# Patient Record
Sex: Female | Born: 1980 | Race: White | Hispanic: No | Marital: Single | Smoking: Former smoker
Health system: Southern US, Community
[De-identification: ages and names within clinical notes are randomized; demographics above are authoritative.]

## PROBLEM LIST (undated history)

## (undated) ENCOUNTER — Inpatient Hospital Stay: Payer: Self-pay

## (undated) DIAGNOSIS — O24419 Gestational diabetes mellitus in pregnancy, unspecified control: Secondary | ICD-10-CM

## (undated) DIAGNOSIS — R87629 Unspecified abnormal cytological findings in specimens from vagina: Secondary | ICD-10-CM

## (undated) DIAGNOSIS — M1990 Unspecified osteoarthritis, unspecified site: Secondary | ICD-10-CM

## (undated) DIAGNOSIS — R569 Unspecified convulsions: Secondary | ICD-10-CM

## (undated) DIAGNOSIS — O139 Gestational [pregnancy-induced] hypertension without significant proteinuria, unspecified trimester: Secondary | ICD-10-CM

## (undated) DIAGNOSIS — K219 Gastro-esophageal reflux disease without esophagitis: Secondary | ICD-10-CM

## (undated) DIAGNOSIS — F419 Anxiety disorder, unspecified: Secondary | ICD-10-CM

## (undated) HISTORY — DX: Unspecified osteoarthritis, unspecified site: M19.90

## (undated) HISTORY — DX: Unspecified convulsions: R56.9

## (undated) HISTORY — PX: ANKLE FRACTURE SURGERY: SHX122

## (undated) HISTORY — DX: Unspecified abnormal cytological findings in specimens from vagina: R87.629

## (undated) HISTORY — DX: Anxiety disorder, unspecified: F41.9

## (undated) HISTORY — DX: Gestational diabetes mellitus in pregnancy, unspecified control: O24.419

## (undated) HISTORY — DX: Gestational (pregnancy-induced) hypertension without significant proteinuria, unspecified trimester: O13.9

## (undated) HISTORY — DX: Gastro-esophageal reflux disease without esophagitis: K21.9

---

## 2015-10-09 DIAGNOSIS — M542 Cervicalgia: Secondary | ICD-10-CM

## 2015-10-09 DIAGNOSIS — G8929 Other chronic pain: Secondary | ICD-10-CM | POA: Insufficient documentation

## 2015-12-01 DIAGNOSIS — M25511 Pain in right shoulder: Secondary | ICD-10-CM

## 2015-12-01 DIAGNOSIS — M50222 Other cervical disc displacement at C5-C6 level: Secondary | ICD-10-CM | POA: Insufficient documentation

## 2015-12-01 DIAGNOSIS — G8929 Other chronic pain: Secondary | ICD-10-CM | POA: Insufficient documentation

## 2015-12-01 DIAGNOSIS — Z8781 Personal history of (healed) traumatic fracture: Secondary | ICD-10-CM | POA: Insufficient documentation

## 2015-12-05 ENCOUNTER — Emergency Department
Admission: EM | Admit: 2015-12-05 | Discharge: 2015-12-05 | Disposition: A | Discharge status: Home / Self Care | Payer: Medicaid Other | Attending: Emergency Medicine | Admitting: Emergency Medicine

## 2015-12-05 ENCOUNTER — Encounter: Payer: Self-pay | Admitting: Emergency Medicine

## 2015-12-05 ENCOUNTER — Emergency Department: Payer: Medicaid Other

## 2015-12-05 DIAGNOSIS — R569 Unspecified convulsions: Secondary | ICD-10-CM

## 2015-12-05 DIAGNOSIS — Z0283 Encounter for blood-alcohol and blood-drug test: Secondary | ICD-10-CM | POA: Insufficient documentation

## 2015-12-05 DIAGNOSIS — I639 Cerebral infarction, unspecified: Secondary | ICD-10-CM | POA: Diagnosis not present

## 2015-12-05 LAB — CBC
HEMATOCRIT: 40.5 % (ref 35.0–47.0)
Hemoglobin: 14.4 g/dL (ref 12.0–16.0)
MCH: 31.8 pg (ref 26.0–34.0)
MCHC: 35.6 g/dL (ref 32.0–36.0)
MCV: 89.3 fL (ref 80.0–100.0)
Platelets: 324 10*3/uL (ref 150–440)
RBC: 4.53 MIL/uL (ref 3.80–5.20)
RDW: 13.5 % (ref 11.5–14.5)
WBC: 13.2 10*3/uL — ABNORMAL HIGH (ref 3.6–11.0)

## 2015-12-05 LAB — URINALYSIS COMPLETE WITH MICROSCOPIC (ARMC ONLY)
BACTERIA UA: NONE SEEN
Bilirubin Urine: NEGATIVE
GLUCOSE, UA: NEGATIVE mg/dL
LEUKOCYTES UA: NEGATIVE
Nitrite: NEGATIVE
Protein, ur: 30 mg/dL — AB
Specific Gravity, Urine: 1.02 (ref 1.005–1.030)
pH: 5 (ref 5.0–8.0)

## 2015-12-05 LAB — BASIC METABOLIC PANEL
Anion gap: 9 (ref 5–15)
BUN: 10 mg/dL (ref 6–20)
CHLORIDE: 101 mmol/L (ref 101–111)
CO2: 27 mmol/L (ref 22–32)
CREATININE: 0.78 mg/dL (ref 0.44–1.00)
Calcium: 9.8 mg/dL (ref 8.9–10.3)
GFR calc Af Amer: >60 mL/min (ref >60)
GFR calc non Af Amer: >60 mL/min (ref >60)
GLUCOSE: 122 mg/dL — AB (ref 65–99)
POTASSIUM: 4.3 mmol/L (ref 3.5–5.1)
Sodium: 137 mmol/L (ref 135–145)

## 2015-12-05 LAB — URINE DRUG SCREEN, QUALITATIVE (ARMC ONLY)
AMPHETAMINES, UR SCREEN: NOT DETECTED
BARBITURATES, UR SCREEN: NOT DETECTED
BENZODIAZEPINE, UR SCRN: POSITIVE — AB
COCAINE METABOLITE, UR ~~LOC~~: NOT DETECTED
Cannabinoid 50 Ng, Ur ~~LOC~~: NOT DETECTED
MDMA (Ecstasy)Ur Screen: NOT DETECTED
METHADONE SCREEN, URINE: NOT DETECTED
Opiate, Ur Screen: POSITIVE — AB
Phencyclidine (PCP) Ur S: POSITIVE — AB
TRICYCLIC, UR SCREEN: NOT DETECTED

## 2015-12-05 LAB — GLUCOSE, CAPILLARY: Glucose-Capillary: 126 mg/dL — ABNORMAL HIGH (ref 65–99)

## 2015-12-05 LAB — POCT PREGNANCY, URINE: PREG TEST UR: NEGATIVE

## 2015-12-05 MED ORDER — HYDROCODONE-ACETAMINOPHEN 5-325 MG PO TABS: 1.0000 | ORAL_TABLET | ORAL | Status: DC | PRN

## 2015-12-05 NOTE — ED Notes (Signed)
Pt to room 32 via w/c with no distress noted, no c/o voiced at present; pt able to ambulate to stretcher without difficulty or distress; st approx hr PTA was sitting on couch watching TV and suddenly fell back with jerking movements that lasted approx 30sec--witnessed by SO who is present at bedside; pt st felt groggy immediately after with some vomiting; denies any recent illness; fx ankle surgery month ago; A&Ox3, PERRL, MAEW

## 2015-12-05 NOTE — Discharge Instructions (Signed)
Please use extreme caution when driving. Do not take tramadol or Ultram any longer. Return to the emergency department for any further seizure-like activity.   Seizure, Adult A seizure is abnormal electrical activity in the brain. Seizures usually last from 30 seconds to 2 minutes. There are various types of seizures. Before a seizure, you may have a warning sensation (aura) that a seizure is about to occur. An aura may include the following symptoms:   Fear or anxiety.  Nausea.  Feeling like the room is spinning (vertigo).  Vision changes, such as seeing flashing lights or spots. Common symptoms during a seizure include:  A change in attention or behavior (altered mental status).  Convulsions with rhythmic jerking movements.  Drooling.  Rapid eye movements.  Grunting.  Loss of bladder and bowel control.  Bitter taste in the mouth.  Tongue biting. After a seizure, you may feel confused and sleepy. You may also have an injury resulting from convulsions during the seizure. HOME CARE INSTRUCTIONS   If you are given medicines, take them exactly as prescribed by your health care provider.  Keep all follow-up appointments as directed by your health care provider.  Do not swim or drive or engage in risky activity during which a seizure could cause further injury to you or others until your health care provider says it is OK.  Get adequate rest.  Teach friends and family what to do if you have a seizure. They should:  Lay you on the ground to prevent a fall.  Put a cushion under your head.  Loosen any tight clothing around your neck.  Turn you on your side. If vomiting occurs, this helps keep your airway clear.  Stay with you until you recover.  Know whether or not you need emergency care. SEEK IMMEDIATE MEDICAL CARE IF:  The seizure lasts longer than 5 minutes.  The seizure is severe or you do not wake up immediately after the seizure.  You have an altered  mental status after the seizure.  You are having more frequent or worsening seizures. Someone should drive you to the emergency department or call local emergency services (911 in [place] MAKE SURE YOU:  Understand these instructions.  Will watch your condition.  Will get help right away if you are not doing well or get worse.   This information is not intended to replace advice given to you by your health care provider. Make sure you discuss any questions you have with your health care provider.   Document Released: 05/24/2000 Document Revised: 06/17/2014 Document Reviewed: 01/06/2013 Elsevier Interactive Patient Education [employer]2016 Elsevier Inc.
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
## 2015-12-05 NOTE — ED Notes (Signed)
Pt presents to ED with c/o possible seizure just prior to arrival. Witnessed by pt friend who states they were watching a movie and pt fell back on couch with jerking motions for about 30 seconds. Pt states she has no hx of the same and was confused about what happened upon awaking. Denies any injuries or pain. Pt was not incontinent of urine and has reportedly been vomiting (X4) since seizure like activity. No abnormal symptoms prior to this event.

## 2015-12-05 NOTE — ED Notes (Signed)

## 2016-02-07 ENCOUNTER — Encounter: Payer: Self-pay | Admitting: Emergency Medicine

## 2016-02-07 ENCOUNTER — Emergency Department: Payer: Medicaid Other

## 2016-02-07 ENCOUNTER — Emergency Department
Admission: EM | Admit: 2016-02-07 | Discharge: 2016-02-07 | Disposition: A | Discharge status: Home / Self Care | Payer: Medicaid Other | Attending: Emergency Medicine | Admitting: Emergency Medicine

## 2016-02-07 DIAGNOSIS — G8929 Other chronic pain: Secondary | ICD-10-CM | POA: Diagnosis not present

## 2016-02-07 DIAGNOSIS — M25572 Pain in left ankle and joints of left foot: Secondary | ICD-10-CM | POA: Diagnosis not present

## 2016-02-07 DIAGNOSIS — M79672 Pain in left foot: Secondary | ICD-10-CM

## 2016-02-07 MED ORDER — OMEPRAZOLE 10 MG PO CPDR: 10.0000 mg | DELAYED_RELEASE_CAPSULE | Freq: Every day | ORAL | 0 refills | Status: DC

## 2016-02-07 MED ORDER — MELOXICAM 15 MG PO TABS: 15.0000 mg | ORAL_TABLET | Freq: Every day | ORAL | 0 refills | Status: DC
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
## 2016-02-07 NOTE — ED Triage Notes (Signed)
States she is having pain to left ankle  Had hardware placed in left ankle in may  [place].  conts to have increased pain

## 2016-03-12 ENCOUNTER — Telehealth: Payer: Self-pay

## 2016-03-12 ENCOUNTER — Ambulatory Visit: Payer: Medicaid Other

## 2016-03-12 ENCOUNTER — Ambulatory Visit (INDEPENDENT_AMBULATORY_CARE_PROVIDER_SITE_OTHER): Payer: Medicaid Other | Admitting: Obstetrics and Gynecology

## 2016-03-12 ENCOUNTER — Other Ambulatory Visit: Payer: Self-pay | Admitting: Obstetrics and Gynecology

## 2016-03-12 VITALS — BP 126/93 | HR 74 | Wt 242.0 lb

## 2016-03-12 DIAGNOSIS — Z3202 Encounter for pregnancy test, result negative: Secondary | ICD-10-CM

## 2016-03-12 DIAGNOSIS — Z3687 Encounter for antenatal screening for uncertain dates: Secondary | ICD-10-CM

## 2016-03-12 DIAGNOSIS — N926 Irregular menstruation, unspecified: Secondary | ICD-10-CM

## 2016-03-12 DIAGNOSIS — O039 Complete or unspecified spontaneous abortion without complication: Secondary | ICD-10-CM

## 2016-03-12 NOTE — Telephone Encounter (Signed)
Pt calls and wanted to know if she could make an appt to check about her missed menses and of course she can and she will call back tomorrow to make this appt. Also wanted to know if she could have a refill on her xanax (2 week supply) until she could get it from another provider and I did inform her that she was not seen by a provider so they would not do this, and this is not a medication they typically prescribed. Pt voiced understanding.

## 2016-03-12 NOTE — Patient Instructions (Signed)
Pregnancy and Zika Virus Disease Zika virus disease, or Zika, is an illness that can spread to people from mosquitoes that carry the virus. It may also spread from person to person through infected body fluids. Zika first occurred in [place], but recently it has spread to new areas. The virus occurs in tropical climates. The location of Zika continues to change. Most people who become infected with Zika virus do not develop serious illness. However, Zika may cause birth defects in an unborn baby whose mother is infected with the virus. It may also increase the risk of miscarriage. WHAT ARE THE SYMPTOMS OF ZIKA VIRUS DISEASE? In many cases, people who have been infected with Zika virus do not develop any symptoms. If symptoms appear, they usually start about a week after the person is infected. Symptoms are usually mild. They may include:  Fever.  Rash.  Red eyes.  Joint pain. HOW DOES ZIKA VIRUS DISEASE SPREAD? The main way that Zika virus spreads is through the bite of a certain type of mosquito. Unlike most types of mosquitos, which bite only at night, the type of mosquito that carries Zika virus bites both at night and during the day. Zika virus can also spread through sexual contact, through a blood transfusion, and from a mother to her baby before or during birth. Once you have had Zika virus disease, it is unlikely that you will get it again. CAN I PASS ZIKA TO MY BABY DURING PREGNANCY? Yes, Zika can pass from a mother to her baby before or during birth. WHAT PROBLEMS CAN ZIKA CAUSE FOR MY BABY? A woman who is infected with Zika virus while pregnant is at risk of having her baby born with a condition in which the brain or head is smaller than expected (microcephaly). Babies who have microcephaly can have developmental delays, seizures, hearing problems, and vision problems. Having Zika virus disease during pregnancy can also increase the risk of miscarriage. HOW CAN ZIKA VIRUS DISEASE BE  PREVENTED? There is no vaccine to prevent Zika. The best way to prevent the disease is to avoid infected mosquitoes and avoid exposure to body fluids that can spread the virus. Avoid any possible exposure to Zika by taking the following precautions. For women and their sex partners:  Avoid traveling to high-risk areas. The locations where Zika is being reported change often. To identify high-risk areas, check the CDC travel website: www.cdc.gov/zika/geo/index.html  If you or your sex partner must travel to a high-risk area, talk with a health care provider before and after traveling.  Take all precautions to avoid mosquito bites if you live in, or travel to, any of the high-risk areas. Insect repellents are safe to use during pregnancy.  Ask your health care provider when it is safe to have sexual contact. For women:  If you are pregnant or trying to become pregnant, avoid sexual contact with persons who may have been exposed to Zika virus, persons who have possible symptoms of Zika, or persons whose history you are unsure about. If you choose to have sexual contact with someone who may have been exposed to Zika virus, use condoms correctly during the entire duration of sexual activity, every time. Do not share sexual devices, as you may be exposed to body fluids.  Ask your health care provider about when it is safe to attempt pregnancy after a possible exposure to Zika virus. WHAT STEPS SHOULD I TAKE TO AVOID MOSQUITO BITES? Take these steps to avoid mosquito bites when you are   in a high-risk area:  Wear loose clothing that covers your arms and legs.  Limit your outdoor activities.  Do not open windows unless they have window screens.  Sleep under mosquito nets.  Use insect repellent. The best insect repellents have:  DEET, picaridin, oil of lemon eucalyptus (OLE), or IR3535 in them.  Higher amounts of an active ingredient in them.  Remember that insect repellents are safe to use  during pregnancy.  Do not use OLE on children who are younger than 3 years of age. Do not use insect repellent on babies who are younger than 2 months of age.  Cover your child's stroller with mosquito netting. Make sure the netting fits snugly and that any loose netting does not cover your child's mouth or nose. Do not use a blanket as a mosquito-protection cover.  Do not apply insect repellent underneath clothing.  If you are using sunscreen, apply the sunscreen before applying the insect repellent.  Treat clothing with permethrin. Do not apply permethrin directly to your skin. Follow label directions for safe use.  Get rid of standing water, where mosquitoes may reproduce. Standing water is often found in items such as buckets, bowls, animal food dishes, and flowerpots. When you return from traveling to any high-risk area, continue taking actions to protect yourself against mosquito bites for 3 weeks, even if you show no signs of illness. This will prevent spreading Zika virus to uninfected mosquitoes. WHAT SHOULD I KNOW ABOUT THE SEXUAL TRANSMISSION OF ZIKA? People can spread Zika to their sexual partners during vaginal, anal, or oral sex, or by sharing sexual devices. Many people with Zika do not develop symptoms, so a person could spread the disease without knowing that they are infected. The greatest risk is to women who are pregnant or who may become pregnant. Zika virus can live longer in semen than it can live in blood. Couples can prevent sexual transmission of the virus by:  Using condoms correctly during the entire duration of sexual activity, every time. This includes vaginal, anal, and oral sex.  Not sharing sexual devices. Sharing increases your risk of being exposed to body fluid from another person.  Avoiding all sexual activity until your health care provider says it is safe. SHOULD I BE TESTED FOR ZIKA VIRUS? A sample of your blood can be tested for Zika virus. A pregnant  woman should be tested if she may have been exposed to the virus or if she has symptoms of Zika. She may also have additional tests done during her pregnancy, such ultrasound testing. Talk with your health care provider about which tests are recommended.   This information is not intended to replace advice given to you by your health care provider. Make sure you discuss any questions you have with your health care provider.   Document Released: 02/15/2015 Document Reviewed: 02/08/2015 Elsevier Interactive Patient Education 2016 Elsevier Inc. Minor Illnesses and Medications in Pregnancy  Cold/Flu:  Sudafed for congestion- Robitussin (plain) for cough- Tylenol for discomfort.  Please follow the directions on the label.  Try not to take any more than needed.  OTC Saline nasal spray and air humidifier or cool-mist  Vaporizer to sooth nasal irritation and to loosen congestion.  It is also important to increase intake of non carbonated fluids, especially if you have a fever.  Constipation:  Colace-2 capsules at bedtime; Metamucil- follow directions on label; Senokot- 1 tablet at bedtime.  Any one of these medications can be used.  It is also   very important to increase fluids and fruits along with regular exercise.  If problem persists please call the office.  Diarrhea:  Kaopectate as directed on the label.  Eat a bland diet and increase fluids.  Avoid highly seasoned foods.  Headache:  Tylenol 1 or 2 tablets every 3-4 hours as needed  Indigestion:  Maalox, Mylanta, Tums or Rolaids- as directed on label.  Also try to eat small meals and avoid fatty, greasy or spicy foods.  Nausea with or without Vomiting:  Nausea in pregnancy is caused by increased levels of hormones in the body which influence the digestive system and cause irritation when stomach acids accumulate.  Symptoms usually subside after 1st trimester of pregnancy.  Try the following: 1. Keep saltines, graham crackers or dry toast by your bed  to eat upon awakening. 2. Don't let your stomach get empty.  Try to eat 5-6 small meals per day instead of 3 large ones. 3. Avoid greasy fatty or highly seasoned foods.  4. Take OTC Unisom 1 tablet at bed time along with OTC Vitamin B6 25-50 mg 3 times per day.    If nausea continues with vomiting and you are unable to keep down food and fluids you may need a prescription medication.  Please notify your provider.   Sore throat:  Chloraseptic spray, throat lozenges and or plain Tylenol.  Vaginal Yeast Infection:  OTC Monistat for 7 days as directed on label.  If symptoms do not resolve within a week notify provider.  If any of the above problems do not subside with recommended treatment please call the office for further assistance.   Do not take Aspirin, Advil, Motrin or Ibuprofen.  * * OTC= Over the counter Hyperemesis Gravidarum Hyperemesis gravidarum is a severe form of nausea and vomiting that happens during pregnancy. Hyperemesis is worse than morning sickness. It may cause you to have nausea or vomiting all day for many days. It may keep you from eating and drinking enough food and liquids. Hyperemesis usually occurs during the first half (the first 20 weeks) of pregnancy. It often goes away once a woman is in her second half of pregnancy. However, sometimes hyperemesis continues through an entire pregnancy.  CAUSES  The cause of this condition is not completely known but is thought to be related to changes in the body's hormones when pregnant. It could be from the high level of the pregnancy hormone or an increase in estrogen in the body.  SIGNS AND SYMPTOMS   Severe nausea and vomiting.  Nausea that does not go away.  Vomiting that does not allow you to keep any food down.  Weight loss and body fluid loss (dehydration).  Having no desire to eat or not liking food you have previously enjoyed. DIAGNOSIS  Your health care provider will do a physical exam and ask you about your  symptoms. He or she may also order blood tests and urine tests to make sure something else is not causing the problem.  TREATMENT  You may only need medicine to control the problem. If medicines do not control the nausea and vomiting, you will be treated in the hospital to prevent dehydration, increased acid in the blood (acidosis), weight loss, and changes in the electrolytes in your body that may harm the unborn baby (fetus). You may need IV fluids.  HOME CARE INSTRUCTIONS   Only take over-the-counter or prescription medicines as directed by your health care provider.  Try eating a couple of dry crackers or   toast in the morning before getting out of bed.  Avoid foods and smells that upset your stomach.  Avoid fatty and spicy foods.  Eat 5-6 small meals a day.  Do not drink when eating meals. Drink between meals.  For snacks, eat high-protein foods, such as cheese.  Eat or suck on things that have ginger in them. Ginger helps nausea.  Avoid food preparation. The smell of food can spoil your appetite.  Avoid iron pills and iron in your multivitamins until after 3-4 months of being pregnant. However, consult with your health care provider before stopping any prescribed iron pills. SEEK MEDICAL CARE IF:   Your abdominal pain increases.  You have a severe headache.  You have vision problems.  You are losing weight. SEEK IMMEDIATE MEDICAL CARE IF:   You are unable to keep fluids down.  You vomit blood.  You have constant nausea and vomiting.  You have excessive weakness.  You have extreme thirst.  You have dizziness or fainting.  You have a fever or persistent symptoms for more than 2-3 days.  You have a fever and your symptoms suddenly get worse. MAKE SURE YOU:   Understand these instructions.  Will watch your condition.  Will get help right away if you are not doing well or get worse.   This information is not intended to replace advice given to you by your  health care provider. Make sure you discuss any questions you have with your health care provider.   Document Released: 05/27/2005 Document Revised: 03/17/2013 Document Reviewed: 01/06/2013 Elsevier Interactive Patient Education 2016 Elsevier Inc. Commonly Asked Questions During Pregnancy  Cats: A parasite can be excreted in cat feces.  To avoid exposure you need to have another person empty the little box.  If you must empty the litter box you will need to wear gloves.  Wash your hands after handling your cat.  This parasite can also be found in raw or undercooked meat so this should also be avoided.  Colds, Sore Throats, Flu: Please check your medication sheet to see what you can take for symptoms.  If your symptoms are unrelieved by these medications please call the office.  Dental Work: Most any dental work your dentist recommends is permitted.  X-rays should only be taken during the first trimester if absolutely necessary.  Your abdomen should be shielded with a lead apron during all x-rays.  Please notify your provider prior to receiving any x-rays.  Novocaine is fine; gas is not recommended.  If your dentist requires a note from us prior to dental work please call the office and we will provide one for you.  Exercise: Exercise is an important part of staying healthy during your pregnancy.  You may continue most exercises you were accustomed to prior to pregnancy.  Later in your pregnancy you will most likely notice you have difficulty with activities requiring balance like riding a bicycle.  It is important that you listen to your body and avoid activities that put you at a higher risk of falling.  Adequate rest and staying well hydrated are a must!  If you have questions about the safety of specific activities ask your provider.    Exposure to Children with illness: Try to avoid obvious exposure; report any symptoms to us when noted,  If you have chicken pos, red measles or mumps, you should  be immune to these diseases.   Please do not take any vaccines while pregnant unless you have checked with   your OB provider.  Fetal Movement: After 28 weeks we recommend you do "kick counts" twice daily.  Lie or sit down in a calm quiet environment and count your baby movements "kicks".  You should feel your baby at least 10 times per hour.  If you have not felt 10 kicks within the first hour get up, walk around and have something sweet to eat or drink then repeat for an additional hour.  If count remains less than 10 per hour notify your provider.  Fumigating: Follow your pest control agent's advice as to how long to stay out of your home.  Ventilate the area well before re-entering.  Hemorrhoids:   Most over-the-counter preparations can be used during pregnancy.  Check your medication to see what is safe to use.  It is important to use a stool softener or fiber in your diet and to drink lots of liquids.  If hemorrhoids seem to be getting worse please call the office.   Hot Tubs:  Hot tubs Jacuzzis and saunas are not recommended while pregnant.  These increase your internal body temperature and should be avoided.  Intercourse:  Sexual intercourse is safe during pregnancy as long as you are comfortable, unless otherwise advised by your provider.  Spotting may occur after intercourse; report any bright red bleeding that is heavier than spotting.  Labor:  If you know that you are in labor, please go to the hospital.  If you are unsure, please call the office and let us help you decide what to do.  Lifting, straining, etc:  If your job requires heavy lifting or straining please check with your provider for any limitations.  Generally, you should not lift items heavier than that you can lift simply with your hands and arms (no back muscles)  Painting:  Paint fumes do not harm your pregnancy, but may make you ill and should be avoided if possible.  Latex or water based paints have less odor than oils.   Use adequate ventilation while painting.  Permanents & Hair Color:  Chemicals in hair dyes are not recommended as they cause increase hair dryness which can increase hair loss during pregnancy.  " Highlighting" and permanents are allowed.  Dye may be absorbed differently and permanents may not hold as well during pregnancy.  Sunbathing:  Use a sunscreen, as skin burns easily during pregnancy.  Drink plenty of fluids; avoid over heating.  Tanning Beds:  Because their possible side effects are still unknown, tanning beds are not recommended.  Ultrasound Scans:  Routine ultrasounds are performed at approximately 20 weeks.  You will be able to see your baby's general anatomy an if you would like to know the gender this can usually be determined as well.  If it is questionable when you conceived you may also receive an ultrasound early in your pregnancy for dating purposes.  Otherwise ultrasound exams are not routinely performed unless there is a medical necessity.  Although you can request a scan we ask that you pay for it when conducted because insurance does not cover " patient request" scans.  Work: If your pregnancy proceeds without complications you may work until your due date, unless your physician or employer advises otherwise.  Round Ligament Pain/Pelvic Discomfort:  Sharp, shooting pains not associated with bleeding are fairly common, usually occurring in the second trimester of pregnancy.  They tend to be worse when standing up or when you remain standing for long periods of time.  These are the result   of pressure of certain pelvic ligaments called "round ligaments".  Rest, Tylenol and heat seem to be the most effective relief.  As the womb and fetus grow, they rise out of the pelvis and the discomfort improves.  Please notify the office if your pain seems different than that described.  It may represent a more serious condition.   

## 2016-03-12 NOTE — Progress Notes (Signed)
I have reviewed the documentation noted by Fenton Mallingebbie Ridgeway, LPN and agree with findings as noted. Patient not pregnant based on today's ultrasound and UPT.   Hildred LaserAnika Mazzy Santarelli, MD [facility]

## 2016-03-12 NOTE — Progress Notes (Addendum)
Morgan BoardsJennifer Ali presents for NOB nurse interview visit. Pregnancy [facility] done [facility] [place]. No date or LMP on this form so pt states she was seen on 8/2/217 of which she was 9 wks with EDD: 08/18/2016. Pt states she had a positive upt at home and at her pcp.  Her EDD was figured on a lmp of 11/13/2015. She is unsure of when her lmp really was. Pt states she spotting some in July, just for a couple of days.  G-3.  P-1011.  Pregnancy education material explained and given.  No cats in the home. NOB labs ordered. TSH/HbgA1c due to Increased BMI, HIV labs and Drug screen were explained optional and she did not decline. Drug screen ordered. PNV encouraged. Genetic screening, to discuss with provider. Follow up for NOB physical will be determined after ultrasound done today.   All questions answered. Ultrasound noted a the very beginning of scan that uterine lining thin, no evidence of pregnancy. I did a UPT here in office and this was also negative. I did ordered a BHCG for pt's peace of mind. New OB labs were not sent to lab. Dr. Valentino Saxonherry aware. Pt made aware of no pregnancy noted.

## 2016-03-12 NOTE — Telephone Encounter (Signed)
Pt called and she sis requesting a call back from you

## 2016-03-13 LAB — PLEASE NOTE

## 2016-03-13 LAB — BETA HCG QUANT (REF LAB)

## 2016-03-20 ENCOUNTER — Ambulatory Visit (INDEPENDENT_AMBULATORY_CARE_PROVIDER_SITE_OTHER): Payer: Medicaid Other | Admitting: Obstetrics and Gynecology

## 2016-03-20 ENCOUNTER — Encounter: Payer: Medicaid Other | Admitting: Obstetrics and Gynecology

## 2016-03-20 ENCOUNTER — Encounter: Payer: Self-pay | Admitting: Obstetrics and Gynecology

## 2016-03-20 VITALS — BP 114/77 | HR 80 | Ht 64.0 in | Wt 245.0 lb

## 2016-03-20 DIAGNOSIS — F419 Anxiety disorder, unspecified: Secondary | ICD-10-CM | POA: Diagnosis not present

## 2016-03-20 DIAGNOSIS — G43009 Migraine without aura, not intractable, without status migrainosus: Secondary | ICD-10-CM

## 2016-03-20 DIAGNOSIS — N912 Amenorrhea, unspecified: Secondary | ICD-10-CM

## 2016-03-20 MED ORDER — MEDROXYPROGESTERONE ACETATE 10 MG PO TABS: 10.0000 mg | ORAL_TABLET | Freq: Every day | ORAL | 2 refills | Status: DC

## 2016-03-20 MED ORDER — ALPRAZOLAM 0.5 MG PO TABS: 0.5000 mg | ORAL_TABLET | Freq: Two times a day (BID) | ORAL | 0 refills | Status: DC

## 2016-03-20 NOTE — Progress Notes (Signed)
GYNECOLOGY PROGRESS NOTE  Subjective:    Patient ID: Morgan Ali, female    DOB: [birth year], 35 y.o.   MRN: [id number]  HPI  Patient is a 35 y.o. [id number] female who presents for complaints of amenorrhea x 2 months. Patient notes positive UPT at home and at a clinic in [place] in August.  Notes bleeding for 3 days at end of August. Pregnancy test and BHCG here in clinic last week was negative as patient presented for NOB intake.  Brief bedside  ultrasound also noted no IUP or extrauterine pregnancy.  Patient denies any recent changes in weight, medications, stress level.  Reports h/o regular periods since menarche.    Past Medical History:  Diagnosis Date  . Anxiety   . Arthritis   . GERD (gastroesophageal reflux disease)   . Gestational diabetes    borderline with daughter  . Pregnancy induced hypertension    flucuates  . Seizures (HCC)    only one due to a reaction to tramadol  . Vaginal Pap smear, abnormal    had colposcopy    Family History  Problem Relation Age of Onset  . Arthritis Mother   . Asthma Mother   . Hyperlipidemia Mother   . Hypertension Mother   . Stroke Mother   . Vision loss Mother   . Alcohol abuse Father   . Kidney disease Father   . Depression Maternal Aunt   . Arthritis Maternal Grandmother   . COPD Maternal Grandmother   . Hyperlipidemia Maternal Grandmother   . Hypertension Maternal Grandmother   . Varicose Veins Maternal Grandmother   . Diabetes Maternal Grandfather   . Hyperlipidemia Maternal Grandfather   . Hypertension Maternal Grandfather   . Hearing loss Paternal Grandmother   . Varicose Veins Paternal Grandmother   . Heart disease Paternal Grandfather     Past Surgical History:  Procedure Laterality Date  . ANKLE FRACTURE SURGERY      Social History   Social History  . Marital status: Single    Spouse name: N/A  . Number of children: N/A  . Years of education: N/A   Occupational History  . Not on file.   Social  History Main Topics  . Smoking status: Former Games developer  . Smokeless tobacco: Never Used  . Alcohol use No  . Drug use: No  . Sexual activity: Yes    Birth control/ protection: None   Other Topics Concern  . Not on file   Social History Narrative  . No narrative on file    Current Outpatient Prescriptions on File Prior to Visit  Medication Sig Dispense Refill  . omeprazole (PRILOSEC) 10 MG capsule Take 1 capsule (10 mg total) by mouth daily. 30 capsule 0  . SUMAtriptan (IMITREX) 25 MG tablet Take by mouth.     No current facility-administered medications on file prior to visit.     Allergies  Allergen Reactions  . Tramadol Other (See Comments)    seizure     Review of Systems - Negative except for:   Psychological ROS: positive for - anxiety (has a h/o anxiety, has worsened since discovery of not being pregnant last week).  Neurological ROS: positive for - headaches (h/o migraines, notes that they are increasing in frequency)   Objective:   Blood pressure 114/77, pulse 80, height 5\' 4"  (1.626 m), weight 245 lb (111.1 kg), last menstrual period 11/13/2015, unknown if currently breastfeeding.  Body mass index is 42.05 kg/m.  General appearance: alert and  no distress, obese Abdomen: soft, non-tender; bowel sounds normal; no masses,  no organomegaly Pelvic: cervix normal in appearance, external genitalia normal, no adnexal masses or tenderness, no cervical motion tenderness, rectovaginal septum normal, uterus normal size, shape, and consistency and vagina normal without discharge Extremities: extremities normal, atraumatic, no cyanosis or edema Neurologic: Grossly normal    Labs:  Results for orders placed or performed in visit on 03/12/16  Beta HCG, Quant  Result Value Ref Range   hCG Quant <1 mIU/mL  Please Note  Result Value Ref Range   Please note Comment      Assessment:   Amenorrhea Recent h/o miscarriage H/o Anxiety H/o migraines  Plan:   - Will  check TSH, prolactin, FSH/LH, progesterone and estradiol levels. If labs wnl, will perform Provera challenge.  Patient to f/u in clinic if no bleeding from Provera. Otherwise, can await next menstrual cycle if she responds to medication.  - Patient reports increased anxiety since discovery that she was no longer pregnant last week.  Has scheduled up an appointment with a therapist but appt is scheduled for next month.  Notes that she used to be on Xanax several years ago, which worked.  Will give 1 month supply.  - Patient with h/o migraines, discussed scheduling appointment with PCP or Neurologist for further management.     Hildred LaserAnika Breezy Hertenstein, MD [facility]

## 2016-03-21 LAB — PROGESTERONE: PROGESTERONE: 2.3 ng/mL

## 2016-03-21 LAB — FSH/LH
FSH: 4.2 m[IU]/mL
LH: 15.3 m[IU]/mL

## 2016-03-21 LAB — TSH: TSH: 1.34 u[IU]/mL (ref 0.450–4.500)

## 2016-03-21 LAB — ESTRADIOL: ESTRADIOL: 85.9 pg/mL

## 2016-03-21 LAB — PROLACTIN: PROLACTIN: 9.5 ng/mL (ref 4.8–23.3)

## 2016-03-22 ENCOUNTER — Other Ambulatory Visit: Payer: Self-pay

## 2016-03-22 DIAGNOSIS — N912 Amenorrhea, unspecified: Secondary | ICD-10-CM

## 2016-03-22 MED ORDER — MEDROXYPROGESTERONE ACETATE 10 MG PO TABS: 10.0000 mg | ORAL_TABLET | Freq: Every day | ORAL | 2 refills | Status: DC

## 2016-04-17 ENCOUNTER — Telehealth: Payer: Self-pay | Admitting: Obstetrics and Gynecology

## 2016-04-17 NOTE — Telephone Encounter (Signed)
SHE NEEDS ALPRAZOLAM REFILLED

## 2016-04-17 NOTE — Telephone Encounter (Signed)
Called pt she states that she has began therapy but therapist is unable to prescribe anything to her until their second meeting, called [facility] for Dr.Brennon to confirm information. Awaiting call back.

## 2016-04-19 NOTE — Telephone Encounter (Signed)
Patient called back wanting to know the status of her refill request.Thanks

## 2016-04-19 NOTE — Telephone Encounter (Signed)
Called [facility] again to verify that pt needs xanax rx from Dr.Cherry, was placed on hold for , no one answer. Please let pt know that we are still awaiting to hear back from them, she is free to call them and let them know that we need to speak with them before she will be given a script. Thanks

## 2016-04-22 ENCOUNTER — Telehealth: Payer: Self-pay | Admitting: Obstetrics and Gynecology

## 2016-04-22 NOTE — Telephone Encounter (Signed)
PT CALLED AND SHE WANT TO KNOW IF THERE WAS A MEDICATION UP HERE FOR HER, I LOOKED AND DIDN'T SEE ONE, SHE WANTED TO KNOW IF DR CHERRY WAS GOING TO GIVE HER A REFILL ON THE ALAPAZA? NOT SURE IF THIS IS HOW YOU SPELL OR IF THIS IS THE CORRECT NAME FOR WHAT SHE IS ASKING ABOUT. SHE WOLD LIKE A CALL BACK.

## 2016-04-22 NOTE — Telephone Encounter (Signed)
There should have already been a telephone encounter open on this pt, I [place] it to [place] I believe. I have contacted the pt's therapist many times to verify that they are unable to prescribe xanax for her and they have not gotten back with me, the pt may want to call them herself and try to expedite this process but we will not prescribe the xanax to her without speaking to her therapist first.

## 2016-04-22 NOTE — Telephone Encounter (Signed)
I WASN'T SURE IF THERE HAS BEEN A SIDE MESSAGE BETWEEN HER AND DR CHERRY, I DID TELL THE PT THAT SHE WAS IN SURGERY TODAY.

## 2016-04-23 NOTE — Telephone Encounter (Signed)
Talked to pt and she is going to call her therapist about this.

## 2016-05-09 ENCOUNTER — Telehealth: Payer: Self-pay | Admitting: Obstetrics and Gynecology

## 2016-05-09 NOTE — Telephone Encounter (Signed)
Pt called and she had a miscarriage and Dr Valentino Saxonherry gave her a medicine to restart her period because she had not had one since the miscarriage, and she had a small one that lasted for 2 days and it was very light and has not had one since, and she wasn't sure if she needed to come in for an appt or if there was something that can be told to her over the phone. She would like a call back.

## 2016-05-09 NOTE — Telephone Encounter (Signed)
Please call this patient and have her scheduled for an appointment to discuss this further and do work up.

## 2016-05-13 NOTE — Telephone Encounter (Signed)
Pt called on 12/1, which was a half a day and Robin schedule her an appt to come in and be seen.

## 2016-06-05 ENCOUNTER — Encounter: Payer: Medicaid Other | Admitting: Obstetrics and Gynecology

## 2016-06-12 ENCOUNTER — Encounter: Payer: Medicaid Other | Admitting: Obstetrics and Gynecology

## 2016-06-19 ENCOUNTER — Encounter: Payer: Medicaid Other | Admitting: Obstetrics and Gynecology

## 2016-07-29 ENCOUNTER — Encounter: Payer: Self-pay | Admitting: Certified Nurse Midwife

## 2016-07-29 ENCOUNTER — Ambulatory Visit (INDEPENDENT_AMBULATORY_CARE_PROVIDER_SITE_OTHER): Payer: Medicaid Other | Admitting: Certified Nurse Midwife

## 2016-07-29 VITALS — BP 122/91 | HR 84 | Temp 97.6°F | Ht 64.0 in | Wt 238.9 lb

## 2016-07-29 DIAGNOSIS — R300 Dysuria: Secondary | ICD-10-CM | POA: Diagnosis not present

## 2016-07-29 DIAGNOSIS — N926 Irregular menstruation, unspecified: Secondary | ICD-10-CM | POA: Diagnosis not present

## 2016-07-29 LAB — POCT URINALYSIS DIPSTICK
BILIRUBIN UA: NEGATIVE
Glucose, UA: NEGATIVE
Ketones, UA: NEGATIVE
NITRITE UA: NEGATIVE
PH UA: 7
Spec Grav, UA: 1.01
Urobilinogen, UA: NEGATIVE

## 2016-07-29 LAB — POCT URINE PREGNANCY: PREG TEST UR: POSITIVE — AB

## 2016-07-29 NOTE — Progress Notes (Signed)
Subjective:   Morgan BoardsJennifer Ali is a 36 y.o. [phone]  being seen today for evaluation of UTI.Patient reports that UTI symptoms have resolved from last week..  She is complaining of increased sense of smell and breast tenderness.  The following portions of the patient's history were reviewed and updated as appropriate: allergies, current medications, past family history, past medical history, past social history, past surgical history and problem list.   Objective:  BP (!) 122/91   Pulse 84   Temp 97.6 F (36.4 C)   Ht 5\' 4"  (1.626 m)   Wt 238 lb 14.4 oz (108.4 kg)   LMP 06/15/2016   Breastfeeding? No   BMI 41.01 kg/m      Results for orders placed or performed in visit on 07/29/16 (from the past 24 hour(s))  POCT urine pregnancy     Status: Abnormal   Collection Time: 07/29/16 10:01 AM  Result Value Ref Range   Preg Test, Ur Positive (A) Negative    Assessment and Plan:    1. Irregular menstrual cycle - POCT urine pregnancy positive LMP 06/15/16 (unsure) -EDC from LMP : 04/09/17 (6 wks 2 days) Return in 2 wks for viability and dating scan Start PNV with folic acid counseled on medications during pregnancy. NOB @ 11-12 wks.  2. Dysuria - POCT urinalysis dipstick moderate leukogytes - Urine culture sent   Miscarriage precautions: moderate to severe cramps and vaginal bleeding reviewed.  Follow up in 2 weeks.  Doreene BurkeAnnie Kleo Dungee, CNM

## 2016-07-31 ENCOUNTER — Encounter: Payer: Self-pay | Admitting: Certified Nurse Midwife

## 2016-07-31 LAB — URINE CULTURE

## 2016-08-06 ENCOUNTER — Telehealth: Payer: Self-pay | Admitting: Certified Nurse Midwife

## 2016-08-06 ENCOUNTER — Other Ambulatory Visit: Payer: Self-pay | Admitting: Certified Nurse Midwife

## 2016-08-06 MED ORDER — DOXYLAMINE-PYRIDOXINE 10-10 MG PO TBEC: DELAYED_RELEASE_TABLET | ORAL | 1 refills | Status: DC

## 2016-08-06 NOTE — Telephone Encounter (Signed)
Spoke with Morgan Ali and explained that her culture did not have enough growth to be considered a UTI and that she does not need treatment at this time. She said her urine was still "dark". Instructed her that she is not taking in enough water and to increase her intake to 8-10 glasses of water a day. She verbalized understanding and agrees to plan. Discussed options for nausea. She states that nothing she has tried is working and would like a prescription. Diclegis ordered and sent to her pharmacy. Message sent to her my chart to review dosing  instructions.   Doreene BurkeAnnie Danitra Payano, CNM

## 2016-08-06 NOTE — Telephone Encounter (Signed)
Pt called and she is having some nausea and wanted something to take that will not make her sleepy, also she stated that she has a UTI and she was treating it at home, but she received a mychart message form Patt[facility]ie telling her that she still has a UTI but was never told if she needed to get an antibiotic, so she wasn't sure about that too. Pt would like a call back./

## 2016-08-06 NOTE — Progress Notes (Signed)
Pt called today regarding persistent nausea that is not improving with other suggested remedies. She denies vomiting.  Requesting medication to help her cope. Prescription for diclegis sent to pharmacy. She will follow up at her NOB appointment as scheduled.   Doreene BurkeAnnie Ison Wichmann, CNM

## 2016-08-07 ENCOUNTER — Other Ambulatory Visit: Payer: Self-pay | Admitting: *Deleted

## 2016-08-07 MED ORDER — DOXYLAMINE-PYRIDOXINE 10-10 MG PO TBEC: DELAYED_RELEASE_TABLET | ORAL | 1 refills | Status: DC

## 2016-08-12 ENCOUNTER — Ambulatory Visit (INDEPENDENT_AMBULATORY_CARE_PROVIDER_SITE_OTHER): Payer: Medicaid Other

## 2016-08-12 ENCOUNTER — Other Ambulatory Visit: Payer: Medicaid Other

## 2016-08-12 DIAGNOSIS — N926 Irregular menstruation, unspecified: Secondary | ICD-10-CM

## 2016-08-26 ENCOUNTER — Ambulatory Visit (INDEPENDENT_AMBULATORY_CARE_PROVIDER_SITE_OTHER): Payer: Medicaid Other

## 2016-08-26 ENCOUNTER — Encounter: Payer: Self-pay | Admitting: Certified Nurse Midwife

## 2016-08-26 ENCOUNTER — Ambulatory Visit: Payer: Medicaid Other | Admitting: Certified Nurse Midwife

## 2016-08-26 ENCOUNTER — Other Ambulatory Visit: Payer: Self-pay | Admitting: *Deleted

## 2016-08-26 ENCOUNTER — Ambulatory Visit (INDEPENDENT_AMBULATORY_CARE_PROVIDER_SITE_OTHER): Payer: Medicaid Other | Admitting: Certified Nurse Midwife

## 2016-08-26 VITALS — BP 129/89 | HR 81 | Wt 237.9 lb

## 2016-08-26 VITALS — BP 129/89 | HR 81 | Wt 239.7 lb

## 2016-08-26 DIAGNOSIS — Z3A08 8 weeks gestation of pregnancy: Secondary | ICD-10-CM

## 2016-08-26 LAB — POCT URINALYSIS DIPSTICK
BILIRUBIN UA: NEGATIVE
Glucose, UA: NEGATIVE
KETONES UA: NEGATIVE
Nitrite, UA: NEGATIVE
PH UA: 7 (ref 5.0–8.0)
SPEC GRAV UA: 1.015 (ref 1.030–1.035)
Urobilinogen, UA: 0.2 (ref ?–2.0)

## 2016-08-26 MED ORDER — ALPRAZOLAM 0.5 MG PO TABS: 0.5000 mg | ORAL_TABLET | Freq: Two times a day (BID) | ORAL | 0 refills | Status: DC

## 2016-08-26 NOTE — Progress Notes (Signed)
Subjective:   Morgan Ali is a 36 y.o. [id number] seen today for an obstetrical problem visit.  Patient reports vaginal bleeding sine 08/24/16. [facility]. Ultrasound  showed gestational age [redacted] weeksand 4 days. The yolk sac is enlarged and no fetal heart tonesd  . HCG 8,226. Presents today for follow up. She continues to have bright red vaginal bleeding with cramping. She states that the cramping has not changed in intensity and is consistent with what she has had since she found out she was pregnant. She has a history of miscarriage in August of last year.   [facility]  IMPRESSION:  Intrauterine gestation with estimated gestational age [redacted] weeksand 4 days. The yolk sac is enlarged and no fetal heart tones are identified. Recommend clinical correlation, serial beta hCGs and short-term follow-up ultrasound.  Result Narrative  EARLY OBSTETRIC ULTRASOUND (TRANSABDOMINAL AND TRANSVAGINAL):  HISTORY:Vaginal bleeding in pregnant female  COMPARISON: [facility]  TRANSABDOMINAL:    The uterus measures 8.6 x 6.7 x 5.3 cm. Intrauterine gestation is identified and will be further characterized on transvaginal imaging.  Urinary bladder is decompressed.  Ovaries are identified with no suspicious adnexal mass. No free fluid.  TRANSVAGINAL:  Intrauterine gestation is identified with no myometrial mass. Yolk sac is abnormal and enlarged measuring 5 mm with no sonographic evidence for calcifications. A suspected fetal pole adjacent to the yolk sac measures 3.8 mm. No fetal heart tones are identified.  The right ovary measures 2.3 x 1.2 x 1.5 cm and the left ovary measures 2.7 x 2.9 x 1.9 cm. Both ovaries demonstrate normal echotexture and Doppler waveforms. No suspicious adnexal mass. No free fluid.   The following portions of the patient's history were reviewed and  updated as appropriate: allergies, current medications, past family history, past medical history, past social history, past surgical history and problem list.   Objective:  BP 129/89   Pulse 81   Wt 239 lb 11.2 oz (108.7 kg)   LMP 06/29/2016   BMI 41.14 kg/m   FHT:    Uterine Size:    Fetal Movement:    Presentation:      Abdomen:  soft, gravid, appropriate for gestational age,non-tender  Vaginal:  Discharge, bloody       Results for orders placed or performed in visit on 08/26/16 (from the past 24 hour(s))  POCT urinalysis dipstick     Status: Abnormal   Collection Time: 08/26/16 11:15 AM  Result Value Ref Range   Color, UA yellow    Clarity, UA clear    Glucose, UA neg    Bilirubin, UA neg    Ketones, UA neg    Spec Grav, UA 1.015 1.030 - 1.035   Blood, UA large    pH, UA 7.0 5.0 - 8.0   Protein, UA trace    Urobilinogen, UA 0.2 Negative - 2.0   Nitrite, UA neg    Leukocytes, UA small (1+) (A) Negative    Assessment and Plan:   Pregnancy:  G4P1021   1. [redacted] weeks gestation of pregnancy with vaginal bleeding  - Ultrasound- revealed 6 wk fetus no heart tones  -hCG, Quantitative -today pending  Reviewed the following with JAnderson [place] WHAT IS AN EARLY PREGNANCY LOSS? Once the egg is fertilized with the sperm and begins to develop, it attaches to the lining of the uterus. This early pregnancy  tissue may not develop into an embryo (the beginning stage of a baby). Sometimes an embryo does develop but does not continue to grow. These problems can be seen on ultrasound.   MANAGEMNT OF EARLY PREGNANCY LOSS: About 4 out of 100 (0.25%) women will have a pregnancy loss in her lifetime.  One in five pregnancies is found to be an early pregnancy loss.  There are 3 ways to care for an early pregnancy loss:   (1) Surgery, (2) Medicine, (3) Waiting for you to pass the pregnancy on your own. The decision as to how to proceed after being diagnosed with and early pregnancy loss is an  individual one.  The decision can be made only after appropriate counseling.  You need to weigh the pros and cons of the 3 choices. Then you can make the choice that works for you.  SURGERY (D&E) . Procedure over in 1 day . Requires being put to sleep . Bleeding may be light . Possible problems during surgery, including injury to womb(uterus) . Care provider has more control Medicine ([place]) . The complete procedure may take days to weeks . No Surgery . Bleeding may be heavy at times . There may be drug side effects . Patient has more control Waiting . You may choose to wait, in which case your own body may complete the passing of the abnormal early pregnancy on its own in about 2-4 weeks . Your bleeding may be heavy at times . There is a small possibility that you may need surgery if the bleeding is too much or not all of the pregnancy has passed.  CYTOTEC MANAGEMENT Prostaglandins (cytotec) are the most widely used drug for this purpose. They cause the uterus to cramp and contract. You will place the medicine yourself inside your vagina in the privacy of your home. Empting of the uterus should occur within 3 days but the process may continue for several weeks. The bleeding may seem heavy at times.  INSTRUCTIONS: Take all 4 tablets of cytotec (865mg total) at one time. This will cause a lot of cramping, you may have bleeding, and pass tissue, then the cramping and bleeding should get better. If you do not pass the tissue, then you can take 4 more tablets of cytotec (8082m total) 48 hours after your first dose.  You will come back to have your blood drawn to make sure the pregnancy hormones are dropping in 1 week. Please call [place] you have any questions.   POSSIBLE SIDE EFFECTS FROM CYTOTEC . Nausea  Vomiting . Diarrhea Fever . Chills  Hot Flashes Side effects  from the process of the early pregnancy loss include: . Cramping  Bleeding . Headaches  Dizziness RISKS: This is a low  risk procedure. Less than 1 in 100 women has a complication. An incomplete passage of the early pregnancy may occur. Also, hemorrhage (heavy bleeding) could happen.  Rarely the pregnancy will not be passed completely. Excessively heavy bleeding may occur.  Your doctor may need to perform surgery to empty the uterus (D&E). Afterwards: Everybody will feel differently after the early pregnancy loss completion. You may have soreness or cramps for a day or two. You may have soreness or cramps for day or two.  You may have light bleeding for up to 2 weeks. You may be as active as you feel like being. If you have any of the following problems you may call [facility] at [phone] Maternity Admissions Unit at [phone]  it is after hours. . If you have pain that does not get better with pain medication . Bleeding that soaks through 2 thick full-sized sanitary pads in an hour . Cramps that last longer than 2 days . Foul smelling discharge . Fever above 100.4 degrees F Even if you do not have any of these symptoms, you should have a follow-up exam to make sure you are healing properly. Your next normal period will usually start again in 4-6 week after the loss. You can get pregnant soon after the loss, so use birth control right away. Finally: Make sure all your questions are answered before during and after any procedure. Follow up with medical care and family planning methods.   Follow up in 1 week if she has not passed products of conception or Return with warning signs discussed. Anora kit given to pt. To collect products for testing.   Philip Aspen, CNM

## 2016-08-26 NOTE — Patient Instructions (Addendum)
Incomplete Miscarriage A miscarriage is the sudden loss of an unborn baby (fetus) before the 20th week of pregnancy. In an incomplete miscarriage, parts of the fetus or placenta (afterbirth) remain in the body. Having a miscarriage can be an emotional experience. Talk with your health care provider about any questions you may have about miscarrying, the grieving process, and your future pregnancy plans. What are the causes?  Problems with the fetal chromosomes that make it impossible for the baby to develop normally. Problems with the baby's genes or chromosomes are most often the result of errors that occur by chance as the embryo divides and grows. The problems are not inherited from the parents.  Infection of the cervix or uterus.  Hormone problems.  Problems with the cervix, such as having an incompetent cervix. This is when the tissue in the cervix is not strong enough to hold the pregnancy.  Problems with the uterus, such as an abnormally shaped uterus, uterine fibroids, or congenital abnormalities.  Certain medical conditions.  Smoking, drinking alcohol, or taking illegal drugs.  Trauma. What are the signs or symptoms?  Vaginal bleeding or spotting, with or without cramps or pain.  Pain or cramping in the abdomen or lower back.  Passing fluid, tissue, or blood clots from the vagina. How is this diagnosed? Your health care provider will perform a physical exam. You may also have an ultrasound to confirm the miscarriage. Blood or urine tests may also be ordered. How is this treated?  Usually, a dilation and curettage (D&C) procedure is performed. During a D&C procedure, the cervix is widened (dilated) and any remaining fetal or placental tissue is gently removed from the uterus.  Antibiotic medicines are prescribed if there is an infection. Other medicines may be given to reduce the size of the uterus (contract) if there is a lot of bleeding.  If you have Rh negative blood and  your baby was Rh positive, you will need a Rho (D) immune globulin shot. This shot will protect any future baby from having Rh blood problems in future pregnancies.  You may be confined to bed rest. This means you should stay in bed and only get up to use the bathroom. Follow these instructions at home:  Rest as directed by your health care provider.  Restrict activity as directed by your health care provider. You may be allowed to continue light activity if curettage was not done but you require further treatment.  Keep track of the number of pads you use each day. Keep track of how soaked (saturated) they are. Record this information.  Do not  use tampons.  Do not douche or have sexual intercourse until approved by your health care provider.  Keep all follow-up appointments for reevaluation and continuing management.  Only take over-the-counter or prescription medicines for pain, fever, or discomfort as directed by your health care provider.  Take antibiotic medicine as directed by your health care provider. Make sure you finish it even if you start to feel better. Get help right away if:  You experience severe cramps in your stomach, back, or abdomen.  You have an unexplained temperature (make sure to record these temperatures).  You pass large clots or tissue (save these for your health care provider to inspect).  Your bleeding increases.  You become light-headed, weak, or have fainting episodes. This information is not intended to replace advice given to you by your health care provider. Make sure you discuss any questions you have with your health   care provider. Document Released: 05/27/2005 Document Revised: 11/02/2015 Document Reviewed: 12/24/2012 Elsevier Interactive Patient Education  2017 [employer]  MANAGEMNT OF EARLY PREGNANCY LOSS: About 4 out of 100 (0.25%) women will have a pregnancy loss in her lifetime.  One in five pregnancies is found to be an early  pregnancy loss.  There are 3 ways to care for an early pregnancy loss:   (1) Surgery, (2) Medicine, (3) Waiting for you to pass the pregnancy on your own. The decision as to how to proceed after being diagnosed with and early pregnancy loss is an individual one.  The decision can be made only after appropriate counseling.  You need to weigh the pros and cons of the 3 choices. Then you can make the choice that works for you.  SURGERY (D&E) . Procedure over in 1 day . Requires being put to sleep . Bleeding may be light . Possible problems during surgery, including injury to womb(uterus) . Care provider has more control Medicine (CYTOTEC) . The complete procedure may take days to weeks . No Surgery . Bleeding may be heavy at times . There may be drug side effects . Patient has more control Waiting . You may choose to wait, in which case your own body may complete the passing of the abnormal early pregnancy on its own in about 2-4 weeks . Your bleeding may be heavy at times . There is a small possibility that you may need surgery if the bleeding is too much or not all of the pregnancy has passed.  CYTOTEC MANAGEMENT Prostaglandins (cytotec) are the most widely used drug for this purpose. They cause the uterus to cramp and contract. You will place the medicine yourself inside your vagina in the privacy of your home. Empting of the uterus should occur within 3 days but the process may continue for several weeks. The bleeding may seem heavy at times.  INSTRUCTIONS: Take all 4 tablets of cytotec ( total) at one time. This will cause a lot of cramping, you may have bleeding, and pass tissue, then the cramping and bleeding should get better. If you do not pass the tissue, then you can take 4 more tablets of cytotec ( total) 48 hours after your first dose.  You will come back to have your blood drawn to make sure the pregnancy hormones are dropping in 1 week. Please call us if you have any  questions.   POSSIBLE SIDE EFFECTS FROM CYTOTEC . Nausea  Vomiting . Diarrhea Fever . Chills  Hot Flashes Side effects  from the process of the early pregnancy loss include: . Cramping  Bleeding . Headaches  Dizziness RISKS: This is a low risk procedure. Less than 1 in 100 women has a complication. An incomplete passage of the early pregnancy may occur. Also, hemorrhage (heavy bleeding) could happen.  Rarely the pregnancy will not be passed completely. Excessively heavy bleeding may occur.  Your doctor may need to perform surgery to empty the uterus (D&E). Afterwards: Everybody will feel differently after the early pregnancy loss completion. You may have soreness or cramps for a day or two. You may have soreness or cramps for day or two.  You may have light bleeding for up to 2 weeks. You may be as active as you feel like being. If you have any of the following problems you may call [facility] at [phone] or Maternity Admissions Unit at [phone] if it is after hours. . If you have pain that does not get  better with pain medication . Bleeding that soaks through 2 thick full-sized sanitary pads in an hour . Cramps that last longer than 2 days . Foul smelling discharge . Fever above 100.4 degrees F Even if you do not have any of these symptoms, you should have a follow-up exam to make sure you are healing properly. Your next normal period will usually start again in 4-6 week after the loss. You can get pregnant soon after the loss, so use birth control right away. Finally: Make sure all your questions are answered before during and after any procedure. Follow up with medical care and family planning methods.

## 2016-08-26 NOTE — Progress Notes (Signed)
OB WORK IN- pt was seen @ [facility] this weekend with vaginal bleeding, pt is passing some clots

## 2016-08-27 ENCOUNTER — Encounter: Payer: Self-pay | Admitting: Certified Nurse Midwife

## 2016-08-27 LAB — BETA HCG QUANT (REF LAB): hCG Quant: 3757 m[IU]/mL

## 2016-09-02 ENCOUNTER — Encounter: Payer: Self-pay | Admitting: Certified Nurse Midwife

## 2016-09-02 ENCOUNTER — Ambulatory Visit (INDEPENDENT_AMBULATORY_CARE_PROVIDER_SITE_OTHER): Payer: Medicaid Other

## 2016-09-02 ENCOUNTER — Encounter: Payer: Self-pay | Admitting: Obstetrics and Gynecology

## 2016-09-02 ENCOUNTER — Ambulatory Visit (INDEPENDENT_AMBULATORY_CARE_PROVIDER_SITE_OTHER): Payer: Medicaid Other | Admitting: Certified Nurse Midwife

## 2016-09-02 ENCOUNTER — Ambulatory Visit (INDEPENDENT_AMBULATORY_CARE_PROVIDER_SITE_OTHER): Payer: Medicaid Other | Admitting: Obstetrics and Gynecology

## 2016-09-02 ENCOUNTER — Other Ambulatory Visit: Payer: Self-pay | Admitting: Certified Nurse Midwife

## 2016-09-02 VITALS — BP 122/78 | HR 80 | Ht 64.0 in | Wt 235.5 lb

## 2016-09-02 VITALS — BP 106/81 | HR 87 | Ht 64.0 in | Wt 237.0 lb

## 2016-09-02 DIAGNOSIS — O021 Missed abortion: Secondary | ICD-10-CM

## 2016-09-02 DIAGNOSIS — Z01818 Encounter for other preprocedural examination: Secondary | ICD-10-CM | POA: Diagnosis not present

## 2016-09-02 NOTE — Progress Notes (Signed)
HPI:      [name] is a 36 y.o. [id number] who LMP was Patient's last menstrual period was 06/29/2016 (exact date).  Subjective:   She presents today For follow-up after [name] CNM diagnosed missed AB and patient has requested D&E for resolution. At this point she is not bleeding or cramping. She has had 2 subsequent ultrasounds revealing fetal pole without fetal heart tones.    Hx: The following portions of the patient's history were reviewed and updated as appropriate:              She  has a past medical history of Anxiety; Arthritis; GERD (gastroesophageal reflux disease); Gestational diabetes; Pregnancy induced hypertension; Seizures (HCC); and Vaginal Pap smear, abnormal. She  does not have any pertinent problems on file. She  has a past surgical history that includes Ankle fracture surgery. Her family history includes Alcohol abuse in her father; Arthritis in her maternal grandmother and mother; Asthma in her mother; COPD in her maternal grandmother; Depression in her maternal aunt; Diabetes in her maternal grandfather; Hearing loss in her paternal grandmother; Heart disease in her paternal grandfather; Hyperlipidemia in her maternal grandfather, maternal grandmother, and mother; Hypertension in her maternal grandfather, maternal grandmother, and mother; Kidney disease in her father; Stroke in her mother; Varicose Veins in her maternal grandmother and paternal grandmother; Vision loss in her mother. She  reports that she has quit smoking. She has never used smokeless tobacco. She reports that she does not drink alcohol or use drugs. Current Outpatient Prescriptions on File Prior to Visit  Medication Sig Dispense Refill  . acetaminophen (TYLENOL) 325 MG tablet Take by mouth.    . ALPRAZolam (XANAX) 0.5 MG tablet Take 1 tablet (0.5 mg total) by mouth 2 (two) times daily. 14 tablet 0  . metroNIDAZOLE (FLAGYL) 500 MG tablet Take 500 mg by mouth 3 (three) times daily.    .  nitrofurantoin, macrocrystal-monohydrate, (MACROBID) 100 MG capsule Take 100 mg by mouth 2 (two) times daily.     No current facility-administered medications on file prior to visit.          Review of Systems:  Review of Systems  Constitutional: Denied constitutional symptoms, night sweats, recent illness, fatigue, fever, insomnia and weight loss.  Eyes: Denied eye symptoms, eye pain, photophobia, vision change and visual disturbance.  Ears/Nose/Throat/Neck: Denied ear, nose, throat or neck symptoms, hearing loss, nasal discharge, sinus congestion and sore throat.  Cardiovascular: Denied cardiovascular symptoms, arrhythmia, chest pain/pressure, edema, exercise intolerance, orthopnea and palpitations.  Respiratory: Denied pulmonary symptoms, asthma, pleuritic pain, productive sputum, cough, dyspnea and wheezing.  Gastrointestinal: Denied, gastro-esophageal reflux, melena, nausea and vomiting.  Genitourinary: Denied genitourinary symptoms including symptomatic vaginal discharge, pelvic relaxation issues, and urinary complaints.  Musculoskeletal: Denied musculoskeletal symptoms, stiffness, swelling, muscle weakness and myalgia.  Dermatologic: Denied dermatology symptoms, rash and scar.  Neurologic: Denied neurology symptoms, dizziness, headache, neck pain and syncope.  Psychiatric: Denied psychiatric symptoms, anxiety and depression.  Endocrine: Denied endocrine symptoms including hot flashes and night sweats.   Meds:   Current Outpatient Prescriptions on File Prior to Visit  Medication Sig Dispense Refill  . acetaminophen (TYLENOL) 325 MG tablet Take by mouth.    . ALPRAZolam (XANAX) 0.5 MG tablet Take 1 tablet (0.5 mg total) by mouth 2 (two) times daily. 14 tablet 0  . metroNIDAZOLE (FLAGYL) 500 MG tablet Take 500 mg by mouth 3 (three) times daily.    . nitrofurantoin, macrocrystal-monohydrate, (MACROBID) 100 MG capsule Take 100  mg by mouth 2 (two) times daily.     No current  facility-administered medications on file prior to visit.     Objective:     Vitals:   09/02/16 1345  BP: 122/78  Pulse: 80              Physical examination General NAD, Conversant  HEENT Atraumatic; Op clear with mmm.  Normo-cephalic. Pupils reactive. Anicteric sclerae  Thyroid/Neck Smooth without nodularity or enlargement. Normal ROM.  Neck Supple.  Skin No rashes, lesions or ulceration. Normal palpated skin turgor. No nodularity.  Breasts: No masses or discharge.  Symmetric.  No axillary adenopathy.  Lungs: Clear to auscultation.No rales or wheezes. Normal Respiratory effort, no retractions.  Heart: NSR.  No murmurs or rubs appreciated. No periferal edema  Abdomen: Soft.  Non-tender.  No masses.  No HSM. No hernia  Extremities: Moves all appropriately.  Normal ROM for age. No lymphadenopathy.  Neuro: Oriented to PPT.  Normal mood. Normal affect.     Pelvic:   Vulva: Normal appearance.  No lesions.  Vagina: No lesions or abnormalities noted.  Support: Normal pelvic support.  Urethra No masses tenderness or scarring.  Meatus Normal size without lesions or prolapse.  Cervix: Normal appearance.  No lesions.  Anus: Normal exam.  No lesions.  Perineum: Normal exam.  No lesions.        Bimanual   Uterus: Enlarged 8 wks  Non-tender.  Mobile.  AV.  Adnexae: No masses.  Non-tender to palpation.  Cul-de-sac: Negative for abnormality.   Ultrasound reveals non-growing crown-rump length with no fetal heart tones.   Assessment:    [id number] Patient Active Problem List   Diagnosis Date Noted  . Herniation of intervertebral disc at C5-C6 level 12/01/2015  . History of fracture of left ankle 12/01/2015  . Right arm pain 12/01/2015  . Neck pain 10/09/2015     1. Preop examination   2. Missed abortion     Patient has expressed desire for D&E for resolution.   Plan:            D&C/E The procedure and the risks and benefits of dilation and curettage/evacuation have been  explained to the patient.  The specific risks of bleeding, infection, anesthesia, uterine perforation, and damage to bowel or bladder  have been specifically discussed.  I have answered all of her questions and I believe that she has an adequate and informed understanding of this procedure.    F/U  Return for As Scheduled Post-op.  Elonda Huskyavid J. Geovanie Winnett, M.D. 09/02/2016 4:21 PM

## 2016-09-02 NOTE — Patient Instructions (Signed)

## 2016-09-02 NOTE — Progress Notes (Signed)
[name] presents today for follow up.She was diagnosed last week with a missed Abortion. She had bleeding last weekend and [facility]. She then presented to clinic on 08/26/16 with bleeding. Ultrasound showed no heart beat and an abnormal yolk sack. Today she denies any bleeding with some abdominal cramping. Repeat ultrasound shows no change from last week.  I reviewed options for treatment for missed abortion. She requests to have a D&C, stating that she has already missed work and has been cramping.  Morgan DikeJennifer will return this afternoon to see Dr. Logan BoresEvans to review D&C, benefits, and complete scheduling for the procedure.   Doreene BurkeAnnie Orie Baxendale, CNM      FACTS YOU SHOULD KNOW  About Early Pregnancy Loss  WHAT IS AN EARLY PREGNANCY LOSS? Once the egg is fertilized with the sperm and begins to develop, it attaches to the lining of the uterus. This early pregnancy tissue may not develop into an embryo (the beginning stage of a baby). Sometimes an embryo does develop but does not continue to grow. These problems can be seen on ultrasound.   MANAGEMNT OF EARLY PREGNANCY LOSS: About 4 out of 100 (0.25%) women will have a pregnancy loss in her lifetime.  One in five pregnancies is found to be an early pregnancy loss.  There are 3 ways to care for an early pregnancy loss:   (1) Surgery, (2) Medicine, (3) Waiting for you to pass the pregnancy on your own. The decision as to how to proceed after being diagnosed with and early pregnancy loss is an individual one.  The decision can be made only after appropriate counseling.  You need to weigh the pros and cons of the 3 choices. Then you can make the choice that works for you.  SURGERY (D&E) . Procedure over in 1 day . Requires being put to sleep . Bleeding may be light . Possible problems during surgery, including injury to womb(uterus) . Care provider has more control Medicine (CYTOTEC) . The complete procedure may take  days to weeks . No Surgery . Bleeding may be heavy at times . There may be drug side effects . Patient has more control Waiting . You may choose to wait, in which case your own body may complete the passing of the abnormal early pregnancy on its own in about 2-4 weeks . Your bleeding may be heavy at times . There is a small possibility that you may need surgery if the bleeding is too much or not all of the pregnancy has passed.  CYTOTEC MANAGEMENT Prostaglandins (cytotec) are the most widely used drug for this purpose. They cause the uterus to cramp and contract. You will place the medicine yourself inside your vagina in the privacy of your home. Empting of the uterus should occur within 3 days but the process may continue for several weeks. The bleeding may seem heavy at times.  INSTRUCTIONS: Take all 4 tablets of cytotec (800mcg total) at one time. This will cause a lot of cramping, you may have bleeding, and pass tissue, then the cramping and bleeding should get better. If you do not pass the tissue, then you can take 4 more tablets of cytotec (800mcg total) 48 hours after your first dose.  You will come back to have your blood drawn to make sure the pregnancy hormones are dropping in 1 week. Please call us if you have any questions.   POSSIBLE SIDE EFFECTS FROM CYTOTEC . Nausea  Vomiting . Diarrhea  Fever . Chills  Hot Flashes Side effects  from the process of the early pregnancy loss include: . Cramping  Bleeding . Headaches  Dizziness RISKS: This is a low risk procedure. Less than 1 in 100 women has a complication. An incomplete passage of the early pregnancy may occur. Also, hemorrhage (heavy bleeding) could happen.  Rarely the pregnancy will not be passed completely. Excessively heavy bleeding may occur.  Your doctor may need to perform surgery to empty the uterus (D&E). Afterwards: Everybody will feel differently after the early pregnancy loss completion. You may have soreness or  cramps for a day or two. You may have soreness or cramps for day or two.  You may have light bleeding for up to 2 weeks. You may be as active as you feel like being. If you have any of the following problems you may call [facility] at [phone] or [facility] at [phone] if it is after hours. . If you have pain that does not get better with pain medication . Bleeding that soaks through 2 thick full-sized sanitary pads in an hour . Cramps that last longer than 2 days . Foul smelling discharge . Fever above 100.4 degrees F Even if you do not have any of these symptoms, you should have a follow-up exam to make sure you are healing properly. Your next normal period will usually start again in 4-6 week after the loss. You can get pregnant soon after the loss, so use birth control right away. Finally: Make sure all your questions are answered before during and after any procedure. Follow up with medical care and family planning methods.

## 2016-09-02 NOTE — H&P (Signed)
PRE-OPERATIVE HISTORY AND PHYSICAL EXAM  PCP:  No PCP Per Patient Subjective:   HPI:  Morgan Ali is a 36 y.o. [id number].  Patient's last menstrual period was 06/29/2016 (exact date).  She presents today for a pre-op discussion and PE.  She presents today For follow-up after Morgan Ali CNM diagnosed missed AB and patient has requested D&E for resolution. At this point she is not bleeding or cramping. She has had 2 subsequent ultrasounds revealing fetal pole without fetal heart tones.  Review of Systems:   Constitutional: Denied constitutional symptoms, night sweats, recent illness, fatigue, fever, insomnia and weight loss.  Eyes: Denied eye symptoms, eye pain, photophobia, vision change and visual disturbance.  Ears/Nose/Throat/Neck: Denied ear, nose, throat or neck symptoms, hearing loss, nasal discharge, sinus congestion and sore throat.  Cardiovascular: Denied cardiovascular symptoms, arrhythmia, chest pain/pressure, edema, exercise intolerance, orthopnea and palpitations.  Respiratory: Denied pulmonary symptoms, asthma, pleuritic pain, productive sputum, cough, dyspnea and wheezing.  Gastrointestinal: Denied, gastro-esophageal reflux, melena, nausea and vomiting.  Genitourinary: Denied genitourinary symptoms including symptomatic vaginal discharge, pelvic relaxation issues, and urinary complaints.  Musculoskeletal: Denied musculoskeletal symptoms, stiffness, swelling, muscle weakness and myalgia.  Dermatologic: Denied dermatology symptoms, rash and scar.  Neurologic: Denied neurology symptoms, dizziness, headache, neck pain and syncope.  Psychiatric: Denied psychiatric symptoms, anxiety and depression.  Endocrine: Denied endocrine symptoms including hot flashes and night sweats.   OB History  Gravida Para Term Preterm AB Living  4 1 1   2 1   SAB TAB Ectopic Multiple Live Births  1 1     1     # Outcome Date GA Lbr Len/2nd Weight Sex Delivery Anes PTL Lv  4 Current            3 Term 10/29/05 1021w0d  7 lb 12.8 oz (3.538 kg) F Vag-Spont  N LIV  2 TAB 09/2004        ND  1 SAB               Past Medical History:  Diagnosis Date  . Anxiety   . Arthritis   . GERD (gastroesophageal reflux disease)   . Gestational diabetes    borderline with daughter  . Pregnancy induced hypertension    flucuates  . Seizures (HCC)    only one due to a reaction to tramadol  . Vaginal Pap smear, abnormal    had colposcopy    Past Surgical History:  Procedure Laterality Date  . ANKLE FRACTURE SURGERY        SOCIAL HISTORY: History  Smoking Status  . Former Smoker  Smokeless Tobacco  . Never Used   History  Alcohol Use No   History  Drug Use No    Family History  Problem Relation Age of Onset  . Arthritis Mother   . Asthma Mother   . Hyperlipidemia Mother   . Hypertension Mother   . Stroke Mother   . Vision loss Mother   . Alcohol abuse Father   . Kidney disease Father   . Depression Maternal Aunt   . Arthritis Maternal Grandmother   . COPD Maternal Grandmother   . Hyperlipidemia Maternal Grandmother   . Hypertension Maternal Grandmother   . Varicose Veins Maternal Grandmother   . Diabetes Maternal Grandfather   . Hyperlipidemia Maternal Grandfather   . Hypertension Maternal Grandfather   . Hearing loss Paternal Grandmother   . Varicose Veins Paternal Grandmother   . Heart disease Paternal Grandfather  ALLERGIES:  Tramadol  MEDS:   Current Outpatient Prescriptions on File Prior to Visit  Medication Sig Dispense Refill  . acetaminophen (TYLENOL) 325 MG tablet Take by mouth.    . ALPRAZolam (XANAX) 0.5 MG tablet Take 1 tablet (0.5 mg total) by mouth 2 (two) times daily. 14 tablet 0  . metroNIDAZOLE (FLAGYL) 500 MG tablet Take 500 mg by mouth 3 (three) times daily.    . nitrofurantoin, macrocrystal-monohydrate, (MACROBID) 100 MG capsule Take 100 mg by mouth 2 (two) times daily.     No current facility-administered medications on file prior  to visit.     No orders of the defined types were placed in this encounter.    Physical examination BP 122/78   Pulse 80   Ht 5\' 4"  (1.626 m)   Wt 235 lb 8 oz (106.8 kg)   LMP 06/29/2016 (Exact Date)   BMI 40.42 kg/m   General NAD, Conversant  HEENT Atraumatic; Op clear with mmm.  Normo-cephalic. Pupils reactive. Anicteric sclerae  Thyroid/Neck Smooth without nodularity or enlargement. Normal ROM.  Neck Supple.  Skin No rashes, lesions or ulceration. Normal palpated skin turgor. No nodularity.  Breasts: No masses or discharge.  Symmetric.  No axillary adenopathy.  Lungs: Clear to auscultation.No rales or wheezes. Normal Respiratory effort, no retractions.  Heart: NSR.  No murmurs or rubs appreciated. No periferal edema  Abdomen: Soft.  Non-tender.  No masses.  No HSM. No hernia  Extremities: Moves all appropriately.  Normal ROM for age. No lymphadenopathy.  Neuro: Oriented to PPT.  Normal mood. Normal affect.     Pelvic:   Vulva: Normal appearance.  No lesions.  Vagina: No lesions or abnormalities noted.  Support: Normal pelvic support.  Urethra No masses tenderness or scarring.  Meatus Normal size without lesions or prolapse.  Cervix: Normal ectropion.  No lesions.  Anus: Normal exam.  No lesions.  Perineum: Normal exam.  No lesions.        Bimanual   Uterus: Enlarged to 8 weeks.  Non-tender.  Mobile.  AV.  Adnexae: No masses.  Non-tender to palpation.  Cul-de-sac: Negative for abnormality.   Assessment:   [id number] Patient Active Problem List   Diagnosis Date Noted  . Herniation of intervertebral disc at C5-C6 level 12/01/2015  . History of fracture of left ankle 12/01/2015  . Right arm pain 12/01/2015  . Neck pain 10/09/2015    1. Preop examination   2. Missed abortion     Patient has expressed her desire for D&E for resolution of missed AB   Plan:   1.  D&E    Elonda Husky, M.D. 09/02/2016 4:27 PM

## 2016-09-04 ENCOUNTER — Encounter
Admission: RE | Disposition: A | Discharge status: Home / Self Care | Payer: Self-pay | Source: Ambulatory Visit | Attending: Obstetrics and Gynecology

## 2016-09-04 ENCOUNTER — Ambulatory Visit
Admission: RE | Admit: 2016-09-04 | Discharge: 2016-09-04 | Disposition: A | Discharge status: Home / Self Care | Payer: Medicaid Other | Source: Ambulatory Visit | Attending: Obstetrics and Gynecology | Admitting: Obstetrics and Gynecology

## 2016-09-04 ENCOUNTER — Ambulatory Visit: Payer: Medicaid Other | Admitting: Anesthesiology

## 2016-09-04 ENCOUNTER — Encounter: Payer: Self-pay | Admitting: *Deleted

## 2016-09-04 DIAGNOSIS — O021 Missed abortion: Secondary | ICD-10-CM

## 2016-09-04 DIAGNOSIS — Z87891 Personal history of nicotine dependence: Secondary | ICD-10-CM | POA: Diagnosis not present

## 2016-09-04 DIAGNOSIS — K219 Gastro-esophageal reflux disease without esophagitis: Secondary | ICD-10-CM | POA: Diagnosis not present

## 2016-09-04 DIAGNOSIS — F419 Anxiety disorder, unspecified: Secondary | ICD-10-CM | POA: Diagnosis not present

## 2016-09-04 HISTORY — PX: DILATION AND EVACUATION: SHX1459

## 2016-09-04 LAB — CBC
HEMATOCRIT: 41.8 % (ref 35.0–47.0)
Hemoglobin: 14.3 g/dL (ref 12.0–16.0)
MCH: 30.4 pg (ref 26.0–34.0)
MCHC: 34.2 g/dL (ref 32.0–36.0)
MCV: 88.9 fL (ref 80.0–100.0)
Platelets: 390 10*3/uL (ref 150–440)
RBC: 4.7 MIL/uL (ref 3.80–5.20)
RDW: 13.1 % (ref 11.5–14.5)
WBC: 10.4 10*3/uL (ref 3.6–11.0)

## 2016-09-04 SURGERY — DILATION AND EVACUATION, UTERUS
Anesthesia: General | Site: Vagina | Wound class: Clean Contaminated

## 2016-09-04 MED ORDER — PROPOFOL 10 MG/ML IV BOLUS
INTRAVENOUS | Status: AC
  Filled 2016-09-04: qty 20

## 2016-09-04 MED ORDER — DEXAMETHASONE SODIUM PHOSPHATE 10 MG/ML IJ SOLN
INTRAMUSCULAR | Status: DC | PRN
  Administered 2016-09-04: 4 mg via INTRAVENOUS

## 2016-09-04 MED ORDER — FENTANYL CITRATE (PF) 100 MCG/2ML IJ SOLN
INTRAMUSCULAR | Status: AC
  Filled 2016-09-04: qty 2

## 2016-09-04 MED ORDER — FENTANYL CITRATE (PF) 100 MCG/2ML IJ SOLN
25.0000 ug | INTRAMUSCULAR | Status: DC | PRN
  Administered 2016-09-04: 25 ug via INTRAVENOUS

## 2016-09-04 MED ORDER — FENTANYL CITRATE (PF) 100 MCG/2ML IJ SOLN
INTRAMUSCULAR | Status: DC | PRN
  Administered 2016-09-04 (×2): 50 ug via INTRAVENOUS

## 2016-09-04 MED ORDER — LACTATED RINGERS IV SOLN
INTRAVENOUS | Status: DC
  Administered 2016-09-04 (×2): via INTRAVENOUS

## 2016-09-04 MED ORDER — LIDOCAINE HCL (CARDIAC) 20 MG/ML IV SOLN
INTRAVENOUS | Status: DC | PRN
  Administered 2016-09-04: 60 mg via INTRAVENOUS

## 2016-09-04 MED ORDER — ONDANSETRON HCL 4 MG/2ML IJ SOLN: 4.0000 mg | Freq: Once | INTRAMUSCULAR | Status: DC | PRN

## 2016-09-04 MED ORDER — DEXAMETHASONE SODIUM PHOSPHATE 10 MG/ML IJ SOLN
INTRAMUSCULAR | Status: AC
  Filled 2016-09-04: qty 1

## 2016-09-04 MED ORDER — ONDANSETRON HCL 4 MG/2ML IJ SOLN
INTRAMUSCULAR | Status: AC
  Filled 2016-09-04: qty 2

## 2016-09-04 MED ORDER — OXYTOCIN 10 UNIT/ML IJ SOLN
INTRAMUSCULAR | Status: AC
  Filled 2016-09-04: qty 2

## 2016-09-04 MED ORDER — PROPOFOL 10 MG/ML IV BOLUS
INTRAVENOUS | Status: DC | PRN
  Administered 2016-09-04: 200 mg via INTRAVENOUS
  Administered 2016-09-04: 50 mg via INTRAVENOUS

## 2016-09-04 MED ORDER — ONDANSETRON HCL 4 MG/2ML IJ SOLN
INTRAMUSCULAR | Status: DC | PRN
  Administered 2016-09-04: 4 mg via INTRAVENOUS

## 2016-09-04 MED ORDER — MIDAZOLAM HCL 2 MG/2ML IJ SOLN
INTRAMUSCULAR | Status: AC
  Filled 2016-09-04: qty 2

## 2016-09-04 MED ORDER — FENTANYL CITRATE (PF) 100 MCG/2ML IJ SOLN
INTRAMUSCULAR | Status: AC
  Administered 2016-09-04: 25 ug via INTRAVENOUS
  Filled 2016-09-04: qty 2

## 2016-09-04 MED ORDER — MIDAZOLAM HCL 2 MG/2ML IJ SOLN
INTRAMUSCULAR | Status: DC | PRN
  Administered 2016-09-04: 2 mg via INTRAVENOUS

## 2016-09-04 SURGICAL SUPPLY — 23 items
ADAPTER VACURETTE TBG SET 14 (CANNULA) ×3 IMPLANT
CATH ROBINSON RED A/P 16FR (CATHETERS) ×3 IMPLANT
DRSG TELFA 3X8 NADH (GAUZE/BANDAGES/DRESSINGS) ×3 IMPLANT
FILTER UTR ASPR SPEC (MISCELLANEOUS) ×1 IMPLANT
FLTR UTR ASPR SPEC (MISCELLANEOUS) ×3
GLOVE ORTHO TXT STRL SZ7.5 (GLOVE) ×3 IMPLANT
GOWN STRL REUS W/ TWL LRG LVL3 (GOWN DISPOSABLE) ×1 IMPLANT
GOWN STRL REUS W/TWL LRG LVL3 (GOWN DISPOSABLE) ×2
KIT BERKELEY 1ST TRIMESTER 3/8 (MISCELLANEOUS) ×3 IMPLANT
KIT RM TURNOVER STRD PROC AR (KITS) ×3 IMPLANT
NEEDLE HYPO 25X1 1.5 SAFETY (NEEDLE) ×3 IMPLANT
PACK DNC HYST (MISCELLANEOUS) ×3 IMPLANT
PAD OB MATERNITY 4.3X12.25 (PERSONAL CARE ITEMS) ×3 IMPLANT
PAD PREP 24X41 OB/GYN DISP (PERSONAL CARE ITEMS) ×3 IMPLANT
SET BERKELEY SUCTION TUBING (SUCTIONS) ×3 IMPLANT
SOL PREP PVP 2OZ (MISCELLANEOUS) ×3
SOLUTION PREP PVP 2OZ (MISCELLANEOUS) ×1 IMPLANT
SPONGE XRAY 4X4 16PLY STRL (MISCELLANEOUS) ×3 IMPLANT
VACURETTE 10 RIGID CVD (CANNULA) ×3 IMPLANT
VACURETTE 12 RIGID CVD (CANNULA) IMPLANT
VACURETTE 7MM F TIP (CANNULA) ×2
VACURETTE 7MM F TIP STRL (CANNULA) ×1 IMPLANT
VACURETTE 8 RIGID CVD (CANNULA) IMPLANT

## 2016-09-04 NOTE — Transfer of Care (Signed)
Immediate Anesthesia Transfer of Care Note  Patient: Morgan Ali  Procedure(s) Performed: Procedure(s): DILATATION AND EVACUATION (N/A)  Patient Location: PACU  Anesthesia Type:General  Level of Consciousness: awake  Airway & Oxygen Therapy: Patient Spontanous Breathing  Post-op Assessment: Report given to RN and Post -op Vital signs reviewed and stable  Post vital signs: Reviewed and stable  Last Vitals:  Vitals:   09/04/16 1103 09/04/16 1253  BP: (!) 147/105 139/87  Pulse: 78 74  Resp: 18 18  Temp: 37.1 C 36.3 C    Last Pain:  Vitals:   09/04/16 1103  TempSrc: Oral  PainSc: 5       Patients Stated Pain Goal: 1 (09/04/16 1103)  Complications: No apparent anesthesia complications

## 2016-09-04 NOTE — Op Note (Signed)
   OPERATIVE NOTE 09/04/2016 12:44 PM  PRE-OPERATIVE DIAGNOSIS:  1) MISSED AB  POST-OPERATIVE DIAGNOSIS:  2) same  OPERATION:  D&E/C  SURGEON(S): Surgeon(s) and Role:    * Linzie Collinavid James Evans, MD - Primary   ANESTHESIA: General  ESTIMATED BLOOD LOSS: 15ml OPERATIVE FINDINGS: intra-uterine contents SPECIMEN: * No specimens in log *  COMPLICATIONS: None  DRAINS: Foley to gravity  DISPOSITION: Stable to recovery room  DESCRIPTION OF PROCEDURE:      The patient was prepped and draped in the dorsal lithotomy position and placed under general anesthesia. Her cervix was grasped with a Jacob's tenaculum. Respecting the position and curvature of her cervix, it was dilated to accommodate a number 10 suction curette. The suction curette was placed within the endometrial cavity and a pressure greater than 65 mmHg was allowed to build. A systematic curettage was performed in all quadrants until no additional tissue was noted. The uterus became firm and globular. Pitocin was run in the IV. The tenaculum was removed from the cervix and hemostasis was noted. The weighted speculum was removed and the patient went to recovery room in stable condition.  F/U 1 week Elonda Huskyavid J. Evans, M.D. 09/04/2016 12:44 PM

## 2016-09-04 NOTE — Discharge Instructions (Signed)

## 2016-09-04 NOTE — H&P (View-Only) (Signed)
PRE-OPERATIVE HISTORY AND PHYSICAL EXAM  PCP:  No PCP Per Patient Subjective:   HPI:  [name] is a 36 y.o. [id number].  Patient's last menstrual period was 06/29/2016 (exact date).  She presents today for a pre-op discussion and PE.  She presents today For follow-up after [name] CNM diagnosed missed AB and patient has requested D&E for resolution. At this point she is not bleeding or cramping. She has had 2 subsequent ultrasounds revealing fetal pole without fetal heart tones.  Review of Systems:   Constitutional: Denied constitutional symptoms, night sweats, recent illness, fatigue, fever, insomnia and weight loss.  Eyes: Denied eye symptoms, eye pain, photophobia, vision change and visual disturbance.  Ears/Nose/Throat/Neck: Denied ear, nose, throat or neck symptoms, hearing loss, nasal discharge, sinus congestion and sore throat.  Cardiovascular: Denied cardiovascular symptoms, arrhythmia, chest pain/pressure, edema, exercise intolerance, orthopnea and palpitations.  Respiratory: Denied pulmonary symptoms, asthma, pleuritic pain, productive sputum, cough, dyspnea and wheezing.  Gastrointestinal: Denied, gastro-esophageal reflux, melena, nausea and vomiting.  Genitourinary: Denied genitourinary symptoms including symptomatic vaginal discharge, pelvic relaxation issues, and urinary complaints.  Musculoskeletal: Denied musculoskeletal symptoms, stiffness, swelling, muscle weakness and myalgia.  Dermatologic: Denied dermatology symptoms, rash and scar.  Neurologic: Denied neurology symptoms, dizziness, headache, neck pain and syncope.  Psychiatric: Denied psychiatric symptoms, anxiety and depression.  Endocrine: Denied endocrine symptoms including hot flashes and night sweats.   OB History  Gravida Para Term Preterm AB Living  4 1 1   2 1   SAB TAB Ectopic Multiple Live Births  1 1     1     # Outcome Date GA Lbr Len/2nd Weight Sex Delivery Anes PTL Lv  4 Current            3 Term 10/29/05 1021w0d  7 lb 12.8 oz (3.538 kg) F Vag-Spont  N LIV  2 TAB 09/2004        ND  1 SAB               Past Medical History:  Diagnosis Date  . Anxiety   . Arthritis   . GERD (gastroesophageal reflux disease)   . Gestational diabetes    borderline with daughter  . Pregnancy induced hypertension    flucuates  . Seizures (HCC)    only one due to a reaction to tramadol  . Vaginal Pap smear, abnormal    had colposcopy    Past Surgical History:  Procedure Laterality Date  . ANKLE FRACTURE SURGERY        SOCIAL HISTORY: History  Smoking Status  . Former Smoker  Smokeless Tobacco  . Never Used   History  Alcohol Use No   History  Drug Use No    Family History  Problem Relation Age of Onset  . Arthritis Mother   . Asthma Mother   . Hyperlipidemia Mother   . Hypertension Mother   . Stroke Mother   . Vision loss Mother   . Alcohol abuse Father   . Kidney disease Father   . Depression Maternal Aunt   . Arthritis Maternal Grandmother   . COPD Maternal Grandmother   . Hyperlipidemia Maternal Grandmother   . Hypertension Maternal Grandmother   . Varicose Veins Maternal Grandmother   . Diabetes Maternal Grandfather   . Hyperlipidemia Maternal Grandfather   . Hypertension Maternal Grandfather   . Hearing loss Paternal Grandmother   . Varicose Veins Paternal Grandmother   . Heart disease Paternal Grandfather  ALLERGIES:  Tramadol  MEDS:   Current Outpatient Prescriptions on File Prior to Visit  Medication Sig Dispense Refill  . acetaminophen (TYLENOL) 325 MG tablet Take by mouth.    . ALPRAZolam (XANAX) 0.5 MG tablet Take 1 tablet (0.5 mg total) by mouth 2 (two) times daily. 14 tablet 0  . metroNIDAZOLE (FLAGYL) 500 MG tablet Take 500 mg by mouth 3 (three) times daily.    . nitrofurantoin, macrocrystal-monohydrate, (MACROBID) 100 MG capsule Take 100 mg by mouth 2 (two) times daily.     No current facility-administered medications on file prior  to visit.     No orders of the defined types were placed in this encounter.    Physical examination BP 122/78   Pulse 80   Ht 5\' 4"  (1.626 m)   Wt 235 lb 8 oz (106.8 kg)   LMP 06/29/2016 (Exact Date)   BMI 40.42 kg/m   General NAD, Conversant  HEENT Atraumatic; Op clear with mmm.  Normo-cephalic. Pupils reactive. Anicteric sclerae  Thyroid/Neck Smooth without nodularity or enlargement. Normal ROM.  Neck Supple.  Skin No rashes, lesions or ulceration. Normal palpated skin turgor. No nodularity.  Breasts: No masses or discharge.  Symmetric.  No axillary adenopathy.  Lungs: Clear to auscultation.No rales or wheezes. Normal Respiratory effort, no retractions.  Heart: NSR.  No murmurs or rubs appreciated. No periferal edema  Abdomen: Soft.  Non-tender.  No masses.  No HSM. No hernia  Extremities: Moves all appropriately.  Normal ROM for age. No lymphadenopathy.  Neuro: Oriented to PPT.  Normal mood. Normal affect.     Pelvic:   Vulva: Normal appearance.  No lesions.  Vagina: No lesions or abnormalities noted.  Support: Normal pelvic support.  Urethra No masses tenderness or scarring.  Meatus Normal size without lesions or prolapse.  Cervix: Normal ectropion.  No lesions.  Anus: Normal exam.  No lesions.  Perineum: Normal exam.  No lesions.        Bimanual   Uterus: Enlarged to 8 weeks.  Non-tender.  Mobile.  AV.  Adnexae: No masses.  Non-tender to palpation.  Cul-de-sac: Negative for abnormality.   Assessment:   [id number] Patient Active Problem List   Diagnosis Date Noted  . Herniation of intervertebral disc at C5-C6 level 12/01/2015  . History of fracture of left ankle 12/01/2015  . Right arm pain 12/01/2015  . Neck pain 10/09/2015    1. Preop examination   2. Missed abortion     Patient has expressed her desire for D&E for resolution of missed AB   Plan:   1.  D&E    Elonda Husky, M.D. 09/02/2016 4:27 PM

## 2016-09-04 NOTE — Progress Notes (Signed)
IV infused and pt asking to go home   States mild cramping but tolerable

## 2016-09-04 NOTE — Interval H&P Note (Signed)
History and Physical Interval Note:  09/04/2016 12:14 PM  Lynita LombardJennifer D Kleiman  has presented today for surgery, with the diagnosis of MISSED AB  The various methods of treatment have been discussed with the patient and family. After consideration of risks, benefits and other options for treatment, the patient has consented to  Procedure(s): DILATATION AND EVACUATION (N/A) as a surgical intervention .  The patient's history has been reviewed, patient examined, no change in status, stable for surgery.  I have reviewed the patient's chart and labs.  Questions were answered to the patient's satisfaction.     Brennan Baileyavid Laurelyn Terrero

## 2016-09-04 NOTE — Anesthesia Post-op Follow-up Note (Cosign Needed)
Anesthesia QCDR form completed.        

## 2016-09-04 NOTE — Anesthesia Preprocedure Evaluation (Signed)
Anesthesia Evaluation  Patient identified by MRN, date of birth, ID band Patient awake    Reviewed: Allergy & Precautions, NPO status , Patient's Chart, lab work & pertinent test results  History of Anesthesia Complications Negative for: history of anesthetic complications  Airway Mallampati: II       Dental   Pulmonary neg pulmonary ROS, former smoker,           Cardiovascular hypertension (flucuating, no meds),      Neuro/Psych Seizures - (one time - reaction to tramadol),  Anxiety    GI/Hepatic Neg liver ROS, GERD (with pregnancy)  ,  Endo/Other  diabetes, GestationalMorbid obesity  Renal/GU negative Renal ROS     Musculoskeletal   Abdominal   Peds  Hematology   Anesthesia Other Findings   Reproductive/Obstetrics                             Anesthesia Physical Anesthesia Plan  ASA: II  Anesthesia Plan: General   Post-op Pain Management:    Induction:   Airway Management Planned: LMA and Oral ETT  Additional Equipment:   Intra-op Plan:   Post-operative Plan:   Informed Consent: I have reviewed the patients History and Physical, chart, labs and discussed the procedure including the risks, benefits and alternatives for the proposed anesthesia with the patient or authorized representative who has indicated his/her understanding and acceptance.     Plan Discussed with:   Anesthesia Plan Comments:         Anesthesia Quick Evaluation

## 2016-09-05 ENCOUNTER — Encounter: Payer: Self-pay | Admitting: Obstetrics and Gynecology

## 2016-09-05 LAB — SURGICAL PATHOLOGY

## 2016-09-05 NOTE — Anesthesia Postprocedure Evaluation (Signed)
Anesthesia Post Note  Patient: Morgan LombardJennifer D Ali  Procedure(s) Performed: Procedure(s) (LRB): DILATATION AND EVACUATION (N/A)  Patient location during evaluation: PACU Anesthesia Type: General Level of consciousness: awake and alert Pain management: pain level controlled Vital Signs Assessment: post-procedure vital signs reviewed and stable Respiratory status: spontaneous breathing and respiratory function stable Cardiovascular status: stable Anesthetic complications: no     Last Vitals:  Vitals:   09/04/16 1335 09/04/16 1415  BP: (!) 145/92 (!) 155/95  Pulse: 67 71  Resp: 16   Temp: 36.2 C     Last Pain:  Vitals:   09/04/16 1335  TempSrc: Temporal  PainSc: 3                  Chevi Lim K

## 2016-09-11 ENCOUNTER — Encounter: Payer: Self-pay | Admitting: Obstetrics and Gynecology

## 2016-09-11 ENCOUNTER — Ambulatory Visit (INDEPENDENT_AMBULATORY_CARE_PROVIDER_SITE_OTHER): Payer: Medicaid Other | Admitting: Obstetrics and Gynecology

## 2016-09-11 VITALS — BP 114/77 | HR 67 | Wt 230.6 lb

## 2016-09-11 DIAGNOSIS — O021 Missed abortion: Secondary | ICD-10-CM

## 2016-09-11 DIAGNOSIS — Z30011 Encounter for initial prescription of contraceptive pills: Secondary | ICD-10-CM | POA: Diagnosis not present

## 2016-09-11 DIAGNOSIS — Z9889 Other specified postprocedural states: Secondary | ICD-10-CM | POA: Diagnosis not present

## 2016-09-11 MED ORDER — DESOGESTREL-ETHINYL ESTRADIOL 0.15-0.02/0.01 MG (21/5) PO TABS: 1.0000 | ORAL_TABLET | Freq: Every day | ORAL | 3 refills | Status: DC

## 2016-09-11 NOTE — Progress Notes (Signed)
HPI:      [name] is a 36 y.o. [phone] who LMP was Patient's last menstrual period was 06/29/2016 (exact date).  Subjective:   She presents today  D and E for missed AB she is doing well. Having a small amount of spotting. Desires birth control.  She is considering pregnancy 6 months from now.    Hx: The following portions of the patient's history were reviewed and updated as appropriate:              She  has a past medical history of Anxiety; Arthritis; GERD (gastroesophageal reflux disease); Gestational diabetes; Pregnancy induced hypertension; Seizures (HCC); and Vaginal Pap smear, abnormal. She  does not have any pertinent problems on file. She  has a past surgical history that includes Ankle fracture surgery and Dilation and evacuation (N/A, 09/04/2016). Her family history includes Alcohol abuse in her father; Arthritis in her maternal grandmother and mother; Asthma in her mother; COPD in her maternal grandmother; Depression in her maternal aunt; Diabetes in her maternal grandfather; Hearing loss in her paternal grandmother; Heart disease in her paternal grandfather; Hyperlipidemia in her maternal grandfather, maternal grandmother, and mother; Hypertension in her maternal grandfather, maternal grandmother, and mother; Kidney disease in her father; Stroke in her mother; Varicose Veins in her maternal grandmother and paternal grandmother; Vision loss in her mother. She  reports that she has quit smoking. She has never used smokeless tobacco. She reports that she does not drink alcohol or use drugs. Current Outpatient Prescriptions on File Prior to Visit  Medication Sig Dispense Refill  . acetaminophen (TYLENOL) 325 MG tablet Take 650 mg by mouth every 4 (four) hours as needed for mild pain or moderate pain.     Marland Kitchen ALPRAZolam (XANAX) 0.5 MG tablet Take 1 tablet (0.5 mg total) by mouth 2 (two) times daily. (Patient taking differently: Take 0.5 mg by mouth 2 (two) times daily as  needed for anxiety. ) 14 tablet 0  . hydrOXYzine (ATARAX/VISTARIL) 50 MG tablet Take 50 mg by mouth 3 (three) times daily as needed for anxiety.    . Prenatal Vit-Fe Fumarate-FA (PRENATAL MULTIVITAMIN) TABS tablet Take 1 tablet by mouth daily.     No current facility-administered medications on file prior to visit.          Review of Systems:  Review of Systems  Constitutional: Denied constitutional symptoms, night sweats, recent illness, fatigue, fever, insomnia and weight loss.  Eyes: Denied eye symptoms, eye pain, photophobia, vision change and visual disturbance.  Ears/Nose/Throat/Neck: Denied ear, nose, throat or neck symptoms, hearing loss, nasal discharge, sinus congestion and sore throat.  Cardiovascular: Denied cardiovascular symptoms, arrhythmia, chest pain/pressure, edema, exercise intolerance, orthopnea and palpitations.  Respiratory: Denied pulmonary symptoms, asthma, pleuritic pain, productive sputum, cough, dyspnea and wheezing.  Gastrointestinal: Denied, gastro-esophageal reflux, melena, nausea and vomiting.  Genitourinary: Denied genitourinary symptoms including symptomatic vaginal discharge, pelvic relaxation issues, and urinary complaints.  Musculoskeletal: Denied musculoskeletal symptoms, stiffness, swelling, muscle weakness and myalgia.  Dermatologic: Denied dermatology symptoms, rash and scar.  Neurologic: Denied neurology symptoms, dizziness, headache, neck pain and syncope.  Psychiatric: Denied psychiatric symptoms, anxiety and depression.  Endocrine: Denied endocrine symptoms including hot flashes and night sweats.   Meds:   Current Outpatient Prescriptions on File Prior to Visit  Medication Sig Dispense Refill  . acetaminophen (TYLENOL) 325 MG tablet Take 650 mg by mouth every 4 (four) hours as needed for mild pain or moderate pain.     Marland Kitchen ALPRAZolam (  XANAX) 0.5 MG tablet Take 1 tablet (0.5 mg total) by mouth 2 (two) times daily. (Patient taking differently: Take  0.5 mg by mouth 2 (two) times daily as needed for anxiety. ) 14 tablet 0  . hydrOXYzine (ATARAX/VISTARIL) 50 MG tablet Take 50 mg by mouth 3 (three) times daily as needed for anxiety.    . Prenatal Vit-Fe Fumarate-FA (PRENATAL MULTIVITAMIN) TABS tablet Take 1 tablet by mouth daily.     No current facility-administered medications on file prior to visit.     Objective:     Vitals:   09/11/16 0951  BP: 114/77  Pulse: 67               Pathology results reviewed with the patient.  Assessment:    [id number] Patient Active Problem List   Diagnosis Date Noted  . Herniation of intervertebral disc at C5-C6 level 12/01/2015  . History of fracture of left ankle 12/01/2015  . Right arm pain 12/01/2015  . Neck pain 10/09/2015     1. Initiation of OCP (BCP)   2. Missed abortion   3. Post-operative state      Patient doing well postop-desires birth control. She has decided upon OCPs.   Plan:            1.  OCPs The risks /benefits of OCPs have been explained to the patient in detail.  Product literature has been given to her.  I have instructed her in the use of OCPs and have given her literature reinforcing this information.  I have explained to the patient that OCPs are not as effective for birth control during the first month of use, and that another form of contraception should be used during this time.  Both first-day start and Sunday start have been explained.  The risks and benefits of each was discussed.  She has been made aware of  the fact that other medications may affect the efficacy of OCPs.  I have answered all of her questions, and I believe that she has an understanding of the effectiveness and use of OCPs. 2.   Timing of next pregnancy discussed. Risks of  and definition of recurrent miscarriage discussed.    Meds ordered this encounter  Medications  . desogestrel-ethinyl estradiol (MIRCETTE) 0.15-0.02/0.01 MG (21/5) tablet    Sig: Take 1 tablet by mouth at bedtime.     Dispense:  1 Package    Refill:  3        F/U  Return for Annual Physical. I spent 17 minutes with this patient of which greater than 50% was spent discussing  Recent miscarriage, previous miscarriage, risks of recurrent miscarriage in the future. Birth control options and selection of OCPs.  Elonda Husky, M.D. 09/11/2016 10:33 AM

## 2016-10-16 DIAGNOSIS — R569 Unspecified convulsions: Secondary | ICD-10-CM | POA: Insufficient documentation

## 2016-10-16 DIAGNOSIS — K219 Gastro-esophageal reflux disease without esophagitis: Secondary | ICD-10-CM | POA: Insufficient documentation

## 2016-10-16 DIAGNOSIS — F419 Anxiety disorder, unspecified: Secondary | ICD-10-CM | POA: Insufficient documentation

## 2016-10-16 NOTE — Progress Notes (Signed)
Patient's Name: Morgan Ali  MRN: [id number]  Referring Provider: [facility]  DOB: 04/07/1981  PCP: [facility]  DOS: 10/22/2016  Note by: Kathlen Brunswick. Dossie Arbour, MD  Service setting: Ambulatory outpatient  Specialty: Interventional Pain Management  Location: [facility] (AMB) Pain Management Facility    Patient type: New Patient   Primary Reason(s) for Visit: Initial Patient Evaluation CC: Ankle Pain (left) and Neck Pain (mainly on the right side of neck)  HPI  Morgan Ali is a 36 y.o. year old, female patient, who comes today for an initial evaluation. She has Chronic neck pain; Herniation of intervertebral disc at C5-C6 level; History of fracture of left ankle; Chronic shoulder pain (Location of Secondary source of pain) (Right); Anxiety; GERD (gastroesophageal reflux disease); Seizures ([place]); Chronic pain syndrome; Cervical spondylosis with radiculopathy; Long term prescription benzodiazepine use; Cervical radiculopathy, chronic; Chronic ankle pain (Location of Primary Source of Pain) (Left); Long term (current) use of opiate analgesic; Long term prescription opiate use; Opiate use; Traumatic arthritis of ankle (Left); and Post-traumatic osteoarthritis of ankle (Left) on her problem list.. Her primarily concern today is the Ankle Pain (left) and Neck Pain (mainly on the right side of neck)  Pain Assessment: Self-Reported Pain Score: 6 /10 Clinically the patient looks like a 1/10 Reported level is inconsistent with clinical observations. Information on the proper use of the pain scale provided to the patient today Pain Type: Chronic pain Pain Location: Ankle Pain Orientation: Left Pain Descriptors / Indicators: Aching, Constant, Throbbing, Sharp (pain increases as the day goes by and patient has some swelling to the ankle) Pain Frequency: Constant  Onset and Duration: Gradual Cause of pain: broken ankle and herniated disc in neck Severity: Getting worse Timing:  Afternoon, Night and During activity or exercise Aggravating Factors: Bending, Walking, Walking uphill, Walking downhill and Working Alleviating Factors: Cold packs, Hot packs and Resting Associated Problems: Swelling, Weakness, Pain that wakes patient up and Pain that does not allow patient to sleep Quality of Pain: Pulsating, Sharp, Shooting, Throbbing and Uncomfortable Previous Examinations or Tests: The patient denies Any biopsies, bone scans, CPT testing, CT scans, CT myelograms, discograms, EMG/PNCV, endoscopies, epidurograms, MRI scans, myelograms, nerve blocks, spinal taps, x-rays, nerve conduction tests, neurological evaluations, neurosurgical evaluations, orthopedic evaluations, chiropractic evaluation, and psychiatric evaluations. Previous Treatments: The patient denies Any biofeedback, chiropractic manipulations, cryo-analgesia, epidural steroid injections, facet blocks, hypnotherapy, morphine pumps, narcotic medications, physical therapy, pool exercises, radiofrequency, relaxation therapy, spinal cord stimulation, steroid treatments by mouth, stretching exercises, the use of a TENS unit, traction, or trigger point injections.  The patient comes into the clinics today for the first time for a chronic pain management evaluation. Patient indicates her primary pain to be that of the left ankle. This pain is described to have started with a fracture in 10/19/2015. She had surgery on 10/27/2015 by Dr. Jari Pigg ([facility]). The patient describes having had one surgery on that left ankle, nerve blocks, no joint injections, and she did have physical therapy 2 times a week 12 weeks. She also indicates having [facility]. The patient's second area of pain is that of the right shoulder. 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 She denies any prior surgeries, nerve blocks, but does admit to 2 joint injections done by her [facility]. She describes that the injections did help for approximately 3-4 months. She also admits having physical therapy  for the right shoulder. This physical therapy was twice a week for a period of 8 weeks. She denies any recent x-rays.  The patient indicates having been to a [facility] pain clinic but only after she was confronted with the fact that she did not volunteer taking any controlled substances. The PMP showed the patient taking benzodiazepines and opioids on a rather regular basis. In addition, the pmp also shows that the patient had been taking pain medication as far back as 06/12/2011 but there is no evidence that any of these pain started that far back. When I asked the patient about that she cannot remember what she was taking medications for that time.  Today I took the time to provide the patient with information regarding my pain practice. The patient was informed that my practice is divided into two sections: an interventional pain management section, as well as a completely separate and distinct medication management section. I explained that I have procedure days for my interventional therapies, and evaluation days for follow-ups and medication management. Because of the amount of documentation required during both, they are kept separated. This means that there is the possibility that she may be scheduled for a procedure on one day, and medication management the next. I have also informed her that because of staffing and facility limitations, I no longer take patients for medication management only. To illustrate the reasons for this, I gave the patient the example of surgeons, and how inappropriate it would be to refer a patient to his/her care, just to write for the post-surgical antibiotics on a surgery done by a different surgeon.   Because interventional pain management is my board-certified specialty, the patient was informed that joining my practice means that they are open  to any and all interventional therapies. I made it clear that this does not mean that they will be forced to have any procedures done. What this means is that I believe interventional therapies to be essential part of the diagnosis and proper management of chronic pain conditions. Therefore, patients not interested in these interventional alternatives will be better served under the care of a different practitioner.  The patient was also made aware of my Comprehensive Pain Management Safety Guidelines where by joining my practice, they limit all of their nerve blocks and joint injections to those done by our practice, for as long as we are retained to manage their care.   Historic Controlled Substance Pharmacotherapy Review  PMP and historical list of controlled substances: Alprazolam 0.252 0.5 mg; hydrocodone/APAP 5/325; oxycodone/APAP 5/325; oxycodone/APAP 10/325; tramadol 50 mg; hydrocodone/APAP 7.5/325; Tylenol No. 3. Highest opioid analgesic regimen found: Oxycodone/APAP 10/325 2 tablets every 4 hours (120 mg/day of oxycodone) (180 MME/Day) Most recent opioid analgesic: Hydrocodone/APAP 5/325 Current opioid analgesics: None Highest recorded MME/day: 180 mg/day MME/day: 0 mg/day Medications: The patient did not bring the medication(s) to the appointment, as requested in our "New Patient Package" Pharmacodynamics: Desired effects: Analgesia: The patient reports >50% benefit. Reported improvement in function: The patient reports medication allows her to accomplish basic ADLs. Clinically meaningful improvement in function (CMIF): Sustained CMIF goals met Perceived effectiveness: Described as relatively effective, allowing for increase in activities of daily living (ADL) Undesirable effects: Side-effects or Adverse reactions: None reported Historical Monitoring: The patient  reports that she does not use drugs. List of all UDS Test(s): Lab Results  Component Value Date   MDMA NONE DETECTED  12/05/2015   COCAINSCRNUR NONE DETECTED 12/05/2015   PCPSCRNUR POSITIVE (A) 12/05/2015  THCU NONE DETECTED 12/05/2015   List of all Serum Drug Screening Test(s):  No results found for: AMPHSCRSER, BARBSCRSER, BENZOSCRSER, COCAINSCRSER, PCPSCRSER, PCPQUANT, THCSCRSER, CANNABQUANT, OPIATESCRSER, OXYSCRSER, PROPOXSCRSER Historical Background Evaluation: [place] PDMP: Six (6) year initial data search conducted. Discrepancies found between information provided by the patient and the database. A pattern of multiple prescribers and multiple pharmacies was identified [place] Department of public safety, offender search: Editor, commissioning Information) Non-contributory Risk Assessment Profile: Aberrant behavior: None observed or detected today. UDS positive for PCP on 12/05/2015. Risk factors for fatal opioid overdose: Concomitant use of Benzodiazepines and None identified today Fatal overdose hazard ratio (HR): Calculation deferred Non-fatal overdose hazard ratio (HR): Calculation deferred Risk of opioid abuse or dependence: 0.7-3.0% with doses ? 36 MME/day and 6.1-26% with doses ? 120 MME/day. Substance use disorder (SUD) risk level: Pending results of Medical Psychology Evaluation for SUD Opioid risk tool (ORT) (Total Score): 0  ORT Scoring interpretation table:  Score <3 = Low Risk for SUD  Score between 4-7 = Moderate Risk for SUD  Score >8 = High Risk for Opioid Abuse   PHQ-2 Depression Scale:  Total score: 0  PHQ-2 Scoring interpretation table: (Score and probability of major depressive disorder)  Score 0 = No depression  Score 1 = 15.4% Probability  Score 2 = 21.1% Probability  Score 3 = 38.4% Probability  Score 4 = 45.5% Probability  Score 5 = 56.4% Probability  Score 6 = 78.6% Probability   PHQ-9 Depression Scale:  Total score: 0  PHQ-9 Scoring interpretation table:  Score 0-4 = No depression  Score 5-9 = Mild depression  Score 10-14 = Moderate depression  Score 15-19 = Moderately severe  depression  Score 20-27 = Severe depression (2.4 times higher risk of SUD and 2.89 times higher risk of overuse)   Pharmacologic Plan: Pending ordered tests and/or consults  Meds  The patient has a current medication list which includes the following prescription(s): acetaminophen, alprazolam, desogestrel-ethinyl estradiol, and hydroxyzine.  Current Outpatient Prescriptions on File Prior to Visit  Medication Sig  . acetaminophen (TYLENOL) 325 MG tablet Take 650 mg by mouth every 4 (four) hours as needed for mild pain or moderate pain.   Marland Kitchen ALPRAZolam (XANAX) 0.5 MG tablet Take 1 tablet (0.5 mg total) by mouth 2 (two) times daily. (Patient taking differently: Take 0.5 mg by mouth 2 (two) times daily as needed for anxiety. )  . desogestrel-ethinyl estradiol (MIRCETTE) 0.15-0.02/0.01 MG (21/5) tablet Take 1 tablet by mouth at bedtime.  . hydrOXYzine (ATARAX/VISTARIL) 50 MG tablet Take 50 mg by mouth 3 (three) times daily as needed for anxiety.   No current facility-administered medications on file prior to visit.    Imaging Review   Note: No results found under the [place] record.        ROS  Cardiovascular History: Negative for hypertension, coronary artery diseas, myocardial infraction, anticoagulant therapy or heart failure Pulmonary or Respiratory History: Negative for bronchial asthma, emphysema, chronic smoking, chronic bronchitis, sarcoidosis, tuberculosis or sleep apena Neurological History: Negative for epilepsy, stroke, urinary or fecal inontinence, spina bifida or tethered cord syndrome Review of Past Neurological Studies:  Results for orders placed or performed during the hospital encounter of 12/05/15  CT Head Wo Contrast   Narrative   CLINICAL DATA:  Seizure this evening.  EXAM: CT HEAD WITHOUT CONTRAST  TECHNIQUE: Contiguous axial images were obtained from the base of the skull through the vertex without intravenous contrast.  COMPARISON:   None.  FINDINGS: There is no intracranial hemorrhage, mass or evidence of acute infarction. There is no extra-axial fluid collection. Gray matter and white matter appear normal. Cerebral volume is normal for age. Brainstem and posterior fossa are unremarkable. The CSF spaces appear normal.  The bony structures are intact. The visible portions of the paranasal sinuses are clear. The orbits are unremarkable.  IMPRESSION: Normal brain   Electronically Signed   By: Andreas Newport M.D.   On: 12/05/2015 21:45    Psychological-Psychiatric History: Negative for anxiety, depression, schizophrenia, bipolar disorders or suicidal ideations or attempts Gastrointestinal History: Negative for peptic ulcer disease, hiatal hernia, GERD, IBS, hepatitis, cirrhosis or pancreatitis Genitourinary History: Negative for nephrolithiasis, hematuria, renal failure or chronic kidney disease Hematological History: Negative for anticoagulant therapy, anemia, bruising or bleeding easily, hemophilia, sickle cell disease or trait, thrombocytopenia or coagulupathies Endocrine History: Negative for diabetes or thyroid disease Rheumatologic History: Osteoarthritis Musculoskeletal History: Negative for myasthenia gravis, muscular dystrophy, multiple sclerosis or malignant hyperthermia Work History: Working full time  Allergies  Ms. Stuckey is allergic to tramadol.  Laboratory Chemistry  Inflammation Markers Lab Results  Component Value Date   ESRSEDRATE 19 10/22/2016   (CRP: Acute Phase) (ESR: Chronic Phase) Renal Function Markers Lab Results  Component Value Date   BUN 11 10/22/2016   CREATININE 1.02 (H) 10/22/2016   GFRAA >60 10/22/2016   GFRNONAA >60 10/22/2016   Hepatic Function Markers Lab Results  Component Value Date   AST 44 (H) 10/22/2016   ALT 37 10/22/2016   ALBUMIN 4.4 10/22/2016   ALKPHOS 83 10/22/2016   Electrolytes Lab Results  Component Value Date   NA 140 10/22/2016   K 3.0  (L) 10/22/2016   CL 107 10/22/2016   CALCIUM 9.1 10/22/2016   MG 2.1 10/22/2016   Neuropathy Markers No results found for: [id number] Bone Pathology Markers Lab Results  Component Value Date   ALKPHOS 83 10/22/2016   CALCIUM 9.1 10/22/2016   Coagulation Parameters Lab Results  Component Value Date   PLT 390 09/04/2016   Cardiovascular Markers Lab Results  Component Value Date   HGB 14.3 09/04/2016   HCT 41.8 09/04/2016   Note: Lab results reviewed.  [place]  Drug: Ms. Unterreiner  reports that she does not use drugs. Alcohol:  reports that she does not drink alcohol. Tobacco:  reports that she has quit smoking. She has never used smokeless tobacco. Medical:  has a past medical history of Anxiety; Arthritis; GERD (gastroesophageal reflux disease); Gestational diabetes; Pregnancy induced hypertension; Seizures ([place]); and Vaginal Pap smear, abnormal. Family: family history includes Alcohol abuse in her father; Arthritis in her maternal grandmother and mother; Asthma in her mother; COPD in her maternal grandmother; Depression in her maternal aunt; Diabetes in her maternal grandfather; Hearing loss in her paternal grandmother; Heart disease in her paternal grandfather; Hyperlipidemia in her maternal grandfather, maternal grandmother, and mother; Hypertension in her maternal grandfather, maternal grandmother, and mother; Kidney disease in her father; Stroke in her mother; Varicose Veins in her maternal grandmother and paternal grandmother; Vision loss in her mother.  Past Surgical History:  Procedure Laterality Date  . ANKLE FRACTURE SURGERY    . DILATION AND EVACUATION N/A 09/04/2016   Procedure: DILATATION AND EVACUATION;  Surgeon: Harlin Heys, MD;  Location: [facility] ORS;  Service: Gynecology;  Laterality: N/A;   Active Ambulatory Problems    Diagnosis Date Noted  . Chronic neck pain 10/09/2015  . Herniation of intervertebral disc at C5-C6 level 12/01/2015  . History  of fracture of  left ankle 12/01/2015  . Chronic shoulder pain (Location of Secondary source of pain) (Right) 12/01/2015  . Anxiety 10/16/2016  . GERD (gastroesophageal reflux disease) 10/16/2016  . Seizures ([place]) 10/16/2016  . Chronic pain syndrome 10/21/2016  . Cervical spondylosis with radiculopathy 10/21/2016  . Long term prescription benzodiazepine use 10/21/2016  . Cervical radiculopathy, chronic 10/21/2016  . Chronic ankle pain (Location of Primary Source of Pain) (Left) 10/22/2016  . Long term (current) use of opiate analgesic 10/22/2016  . Long term prescription opiate use 10/22/2016  . Opiate use 10/22/2016  . Traumatic arthritis of ankle (Left) 10/22/2016  . Post-traumatic osteoarthritis of ankle (Left) 10/22/2016   Resolved Ambulatory Problems    Diagnosis Date Noted  . No Resolved Ambulatory Problems   Past Medical History:  Diagnosis Date  . Anxiety   . Arthritis   . GERD (gastroesophageal reflux disease)   . Gestational diabetes   . Pregnancy induced hypertension   . Seizures ([place])   . Vaginal Pap smear, abnormal    Constitutional Exam  General appearance: Well nourished, well developed, and well hydrated. In no apparent acute distress Vitals:   10/22/16 0823  BP: (!) 131/95  Pulse: 71  Resp: 16  Temp: 98.3 F (36.8 C)  SpO2: 99%  Weight: 230 lb (104.3 kg)  Height: 5' 4"  (1.626 m)   BMI Assessment: Estimated body mass index is 39.48 kg/m as calculated from the following:   Height as of this encounter: 5' 4"  (1.626 m).   Weight as of this encounter: 230 lb (104.3 kg).  BMI interpretation table: BMI level Category Range association with higher incidence of chronic pain  <18 kg/m2 Underweight   18.5-24.9 kg/m2 Ideal body weight   25-29.9 kg/m2 Overweight Increased incidence by 20%  30-34.9 kg/m2 Obese (Class I) Increased incidence by 68%  35-39.9 kg/m2 Severe obesity (Class II) Increased incidence by 136%  >40 kg/m2 Extreme obesity (Class III) Increased incidence by  254%   BMI Readings from Last 4 Encounters:  10/22/16 39.48 kg/m  09/11/16 39.58 kg/m  09/04/16 40.34 kg/m  09/02/16 40.42 kg/m   Wt Readings from Last 4 Encounters:  10/22/16 230 lb (104.3 kg)  09/11/16 230 lb 9 oz (104.6 kg)  09/04/16 235 lb (106.6 kg)  09/02/16 235 lb 8 oz (106.8 kg)  Psych/Mental status: Alert, oriented x 3 (person, place, & time)       Eyes: PERLA Respiratory: No evidence of acute respiratory distress  Cervical Spine Exam  Inspection: No masses, redness, or swelling Alignment: Symmetrical Functional ROM: Unrestricted ROM      Stability: No instability detected Muscle strength & Tone: Functionally intact Sensory: Unimpaired Palpation: No palpable anomalies              Upper Extremity (UE) Exam    Side: Right upper extremity  Side: Left upper extremity  Inspection: No masses, redness, swelling, or asymmetry. No contractures  Inspection: No masses, redness, swelling, or asymmetry. No contractures  Functional ROM: Unrestricted ROM          Functional ROM: Unrestricted ROM          Muscle strength & Tone: Functionally intact  Muscle strength & Tone: Functionally intact  Sensory: Unimpaired  Sensory: Unimpaired  Palpation: No palpable anomalies              Palpation: No palpable anomalies              Specialized Test(s): Deferred  Specialized Test(s): Deferred          Thoracic Spine Exam  Inspection: No masses, redness, or swelling Alignment: Symmetrical Functional ROM: Unrestricted ROM Stability: No instability detected Sensory: Unimpaired Muscle strength & Tone: No palpable anomalies  Lumbar Spine Exam  Inspection: No masses, redness, or swelling Alignment: Symmetrical Functional ROM: Unrestricted ROM      Stability: No instability detected Muscle strength & Tone: Functionally intact Sensory: Unimpaired Palpation: No palpable anomalies       Provocative Tests: Lumbar Hyperextension and rotation test: evaluation deferred today        Patrick's Maneuver: evaluation deferred today                    Gait & Posture Assessment  Ambulation: Unassisted Gait: Relatively normal for age and body habitus Posture: WNL   Lower Extremity Exam    Side: Right lower extremity  Side: Left lower extremity  Inspection: No masses, redness, swelling, or asymmetry. No contractures  Inspection: No masses, redness, swelling, or asymmetry. No contractures  Functional ROM: Unrestricted ROM          Functional ROM: Unrestricted ROM          Muscle strength & Tone: Functionally intact  Muscle strength & Tone: Functionally intact  Sensory: Unimpaired  Sensory: Unimpaired  Palpation: No palpable anomalies  Palpation: No palpable anomalies   Assessment  Primary Diagnosis & Pertinent Problem List: The primary encounter diagnosis was Chronic pain syndrome. Diagnoses of Chronic ankle pain (Location of Primary Source of Pain) (Left), Chronic shoulder pain (Location of Secondary source of pain) (Right), Cervical spondylosis with radiculopathy, Long term prescription benzodiazepine use, Long term (current) use of opiate analgesic, Long term prescription opiate use, Opiate use, Traumatic arthritis of ankle (Left), and Post-traumatic osteoarthritis of ankle (Left) were also pertinent to this visit.  Visit Diagnosis: 1. Chronic pain syndrome   2. Chronic ankle pain (Location of Primary Source of Pain) (Left)   3. Chronic shoulder pain (Location of Secondary source of pain) (Right)   4. Cervical spondylosis with radiculopathy   5. Long term prescription benzodiazepine use   6. Long term (current) use of opiate analgesic   7. Long term prescription opiate use   8. Opiate use   9. Traumatic arthritis of ankle (Left)   10. Post-traumatic osteoarthritis of ankle (Left)    Plan of Care  Initial treatment plan:  Please be advised that as per protocol, today's visit has been an evaluation only. We have not taken over the patient's controlled substance  management.  Problem-specific plan: No problem-specific Assessment & Plan notes found for this encounter.  Ordered Lab-work, Procedure(s), Referral(s), & Consult(s): Orders Placed This Encounter  Procedures  . DG Cervical Spine Complete  . DG Shoulder Right  . DG Ankle Complete Left  . Compliance Drug Analysis, Ur  . C-reactive protein  . Magnesium  . Comprehensive metabolic panel  . Sedimentation rate  . Vitamin B12  . 25-Hydroxyvitamin D Lcms D2+D3  . Ambulatory referral to Psychology   Pharmacotherapy: Medications ordered:  No orders of the defined types were placed in this encounter.  Medications administered during this visit: Ms. Valley had no medications administered during this visit.   Pharmacotherapy under consideration:  Opioid Analgesics: The patient was informed that there is no guarantee that she would be a candidate for opioid analgesics. The decision will be made following CDC guidelines. This decision will be based on the results of diagnostic studies, as well as  Ms. Lawrance risk profile.  Membrane stabilizer: To be determined at a later time Muscle relaxant: To be determined at a later time NSAID: To be determined at a later time Other analgesic(s): To be determined at a later time   Interventional therapies under consideration: Ms. Craine was informed that there is no guarantee that she would be a candidate for interventional therapies. The decision will be based on the results of diagnostic studies, as well as Ms. Kirker's risk profile.  Possible procedure(s): Diagnostic left ankle block  Diagnostic right intra-articular shoulder joint injection  Diagnostic right suprascapular nerve block  Possible right suprascapular RFA    Provider-requested follow-up: Return for 2nd Visit, after MedPsych eval, w/ MD.  Future Appointments Date Time Provider [place]  12/17/2016 9:30 AM Harlin Heys, MD [facility] None    Primary Care Physician: [facility]  [place] Location: [facility] Outpatient Pain Management Facility Note by: Kathlen Brunswick. Dossie Arbour, M.D, DABA, DABAPM, DABPM, DABIPP, FIPP Date: 10/22/2016; Time: 1:32 PM  Patient instructions provided during this appointment: Patient Instructions   GENERAL RISKS AND COMPLICATIONS  What are the risk, side effects and possible complications? Generally speaking, most procedures are safe.  However, with any procedure there are risks, side effects, and the possibility of complications.  The risks and complications are dependent upon the sites that are lesioned, or the type of nerve block to be performed.  The closer the procedure is to the spine, the more serious the risks are.  Great care is taken when placing the radio frequency needles, block needles or lesioning probes, but sometimes complications can occur. 1. Infection: Any time there is an injection through the skin, there is a risk of infection.  This is why sterile conditions are used for these blocks.  There are four possible types of infection. 1. Localized skin infection. 2. Central Nervous System Infection-This can be in the form of Meningitis, which can be deadly. 3. Epidural Infections-This can be in the form of an epidural abscess, which can cause pressure inside of the spine, causing compression of the spinal cord with subsequent paralysis. This would require an emergency surgery to decompress, and there are no guarantees that the patient would recover from the paralysis. 4. Discitis-This is an infection of the intervertebral discs.  It occurs in about 1% of discography procedures.  It is difficult to treat and it may lead to surgery.        2. Pain: the needles have to go through skin and soft tissues, will cause soreness.       3. Damage to internal structures:  The nerves to be lesioned may be near blood vessels or    other nerves which can be potentially damaged.       4. Bleeding: Bleeding is more common if the patient  is taking blood thinners such as  aspirin, Coumadin, Ticiid, Plavix, etc., or if he/she have some genetic predisposition  such as hemophilia. Bleeding into the spinal canal can cause compression of the spinal  cord with subsequent paralysis.  This would require an emergency surgery to  decompress and there are no guarantees that the patient would recover from the  paralysis.       5. Pneumothorax:  Puncturing of a lung is a possibility, every time a needle is introduced in  the area of the chest or upper back.  Pneumothorax refers to free air around the  collapsed lung(s), inside of the thoracic cavity (chest cavity).  Another two possible  complications related to a similar event would include: Hemothorax and Chylothorax.   These are variations of the Pneumothorax, where instead of air around the collapsed  lung(s), you may have blood or chyle, respectively.       6. Spinal headaches: They may occur with any procedures in the area of the spine.       7. Persistent CSF (Cerebro-Spinal Fluid) leakage: This is a rare problem, but may occur  with prolonged intrathecal or epidural catheters either due to the formation of a fistulous  track or a dural tear.       8. Nerve damage: By working so close to the spinal cord, there is always a possibility of  nerve damage, which could be as serious as a permanent spinal cord injury with  paralysis.       9. Death:  Although rare, severe deadly allergic reactions known as "Anaphylactic  reaction" can occur to any of the medications used.      10. Worsening of the symptoms:  We can always make thing worse.  What are the chances of something like this happening? Chances of any of this occuring are extremely low.  By statistics, you have more of a chance of getting killed in a motor vehicle accident: while driving to the hospital than any of the above occurring .  Nevertheless, you should be aware that they are possibilities.  In general, it is similar to taking a shower.   Everybody knows that you can slip, hit your head and get killed.  Does that mean that you should not shower again?  Nevertheless always keep in mind that statistics do not mean anything if you happen to be on the wrong side of them.  Even if a procedure has a 1 (one) in a 1,000,000 (million) chance of going wrong, it you happen to be that one..Also, keep in mind that by statistics, you have more of a chance of having something go wrong when taking medications.  Who should not have this procedure? If you are on a blood thinning medication (e.g. Coumadin, Plavix, see list of "Blood Thinners"), or if you have an active infection going on, you should not have the procedure.  If you are taking any blood thinners, please inform your physician.  How should I prepare for this procedure?  Do not eat or drink anything at least six hours prior to the procedure.  Bring a driver with you .  It cannot be a taxi.  Come accompanied by an adult that can drive you back, and that is strong enough to help you if your legs get weak or numb from the local anesthetic.  Take all of your medicines the morning of the procedure with just enough water to swallow them.  If you have diabetes, make sure that you are scheduled to have your procedure done first thing in the morning, whenever possible.  If you have diabetes, take only half of your insulin dose and notify our nurse that you have done so as soon as you arrive at the clinic.  If you are diabetic, but only take blood sugar pills (oral hypoglycemic), then do not take them on the morning of your procedure.  You may take them after you have had the procedure.  Do not take aspirin or any aspirin-containing medications, at least eleven (11) days prior to the procedure.  They may prolong bleeding.  Wear loose fitting clothing that may be easy to take off and that you  would not mind if it got stained with Betadine or blood.  Do not wear any jewelry or perfume  Remove  any nail coloring.  It will interfere with some of our monitoring equipment.  NOTE: Remember that this is not meant to be interpreted as a complete list of all possible complications.  Unforeseen problems may occur.  BLOOD THINNERS The following drugs contain aspirin or other products, which can cause increased bleeding during surgery and should not be taken for 2 weeks prior to and 1 week after surgery.  If you should need take something for relief of minor pain, you may take acetaminophen which is found in Tylenol,m Datril, Anacin-3 and Panadol. It is not blood thinner. The products listed below are.  Do not take any of the products listed below in addition to any listed on your instruction sheet.  A.P.C or A.P.C with Codeine Codeine Phosphate Capsules #3 Ibuprofen Ridaura  ABC compound Congesprin Imuran rimadil  Advil Cope Indocin Robaxisal  Alka-Seltzer Effervescent Pain Reliever and Antacid Coricidin or Coricidin-D  Indomethacin Rufen  Alka-Seltzer plus Cold Medicine Cosprin Ketoprofen S-A-C Tablets  Anacin Analgesic Tablets or Capsules Coumadin Korlgesic Salflex  Anacin Extra Strength Analgesic tablets or capsules CP-2 Tablets Lanoril Salicylate  Anaprox Cuprimine Capsules Levenox Salocol  Anexsia-D Dalteparin Magan Salsalate  Anodynos Darvon compound Magnesium Salicylate Sine-off  Ansaid Dasin Capsules Magsal Sodium Salicylate  Anturane Depen Capsules Marnal Soma  APF Arthritis pain formula Dewitt's Pills Measurin Stanback  Argesic Dia-Gesic Meclofenamic Sulfinpyrazone  Arthritis Bayer Timed Release Aspirin Diclofenac Meclomen Sulindac  Arthritis pain formula Anacin Dicumarol Medipren Supac  Analgesic (Safety coated) Arthralgen Diffunasal Mefanamic Suprofen  Arthritis Strength Bufferin Dihydrocodeine Mepro Compound Suprol  Arthropan liquid Dopirydamole Methcarbomol with Aspirin Synalgos  ASA tablets/Enseals Disalcid Micrainin Tagament  Ascriptin Doan's Midol Talwin  Ascriptin A/D  Dolene Mobidin Tanderil  Ascriptin Extra Strength Dolobid Moblgesic Ticlid  Ascriptin with Codeine Doloprin or Doloprin with Codeine Momentum Tolectin  Asperbuf Duoprin Mono-gesic Trendar  Aspergum Duradyne Motrin or Motrin IB Triminicin  Aspirin plain, buffered or enteric coated Durasal Myochrisine Trigesic  Aspirin Suppositories Easprin Nalfon Trillsate  Aspirin with Codeine Ecotrin Regular or Extra Strength Naprosyn Uracel  Atromid-S Efficin Naproxen Ursinus  Auranofin Capsules Elmiron Neocylate Vanquish  Axotal Emagrin Norgesic Verin  Azathioprine Empirin or Empirin with Codeine Normiflo Vitamin E  Azolid Emprazil Nuprin Voltaren  Bayer Aspirin plain, buffered or children's or timed BC Tablets or powders Encaprin Orgaran Warfarin Sodium  Buff-a-Comp Enoxaparin Orudis Zorpin  Buff-a-Comp with Codeine Equegesic Os-Cal-Gesic   Buffaprin Excedrin plain, buffered or Extra Strength Oxalid   Bufferin Arthritis Strength Feldene Oxphenbutazone   Bufferin plain or Extra Strength Feldene Capsules Oxycodone with Aspirin   Bufferin with Codeine Fenoprofen Fenoprofen Pabalate or Pabalate-SF   Buffets II Flogesic Panagesic   Buffinol plain or Extra Strength Florinal or Florinal with Codeine Panwarfarin   Buf-Tabs Flurbiprofen Penicillamine   Butalbital Compound Four-way cold tablets Penicillin   Butazolidin Fragmin Pepto-Bismol   Carbenicillin Geminisyn Percodan   Carna Arthritis Reliever Geopen Persantine   Carprofen Gold's salt Persistin   Chloramphenicol Goody's Phenylbutazone   Chloromycetin Haltrain Piroxlcam   Clmetidine heparin Plaquenil   Cllnoril Hyco-pap Ponstel   Clofibrate Hydroxy chloroquine Propoxyphen         Before stopping any of these medications, be sure to consult the physician who ordered them.  Some, such as Coumadin (Warfarin) are ordered to prevent or treat serious conditions such as "deep thrombosis", "pumonary embolisms", and other heart problems.  The amount of time  that you may need off of the medication may also vary with the medication and the reason for which you were taking it.  If you are taking any of these medications, please make sure you notify your pain physician before you undergo any procedures. Pain Score  Introduction: The pain score used by this practice is the Verbal Numerical Rating Scale (VNRS-11). This is an 11-point scale. It is for adults and children 10 years or older. There are significant differences in how the pain score is reported, used, and applied. Forget everything you learned in the past and learn this scoring system.  General Information: The scale should reflect your current level of pain. Unless you are specifically asked for the level of your worst pain, or your average pain. If you are asked for one of these two, then it should be understood that it is over the past 24 hours.  Basic Activities of Daily Living (ADL): Personal hygiene, dressing, eating, transferring, and using restroom.  Instructions: Most patients tend to report their level of pain as a combination of two factors, their physical pain and their psychosocial pain. This last one is also known as "suffering" and it is reflection of how physical pain affects you socially and psychologically. From now on, report them separately. From this point on, when asked to report your pain level, report only your physical pain. Use the following table for reference.  Pain Clinic Pain Levels (0-5/10)  Pain Level Score Description  No Pain 0   Mild pain 1 Nagging, annoying, but does not interfere with basic activities of daily living (ADL). Patients are able to eat, bathe, get dressed, toileting (being able to get on and off the toilet and perform personal hygiene functions), transfer (move in and out of bed or a chair without assistance), and maintain continence (able to control bladder and bowel functions). Blood pressure and heart rate are unaffected. A normal heart rate for a  healthy adult ranges from 60 to 100 bpm (beats per minute).   Mild to moderate pain 2 Noticeable and distracting. Impossible to hide from other people. More frequent flare-ups. Still possible to adapt and function close to normal. It can be very annoying and may have occasional stronger flare-ups. With discipline, patients may get used to it and adapt.   Moderate pain 3 Interferes significantly with activities of daily living (ADL). It becomes difficult to feed, bathe, get dressed, get on and off the toilet or to perform personal hygiene functions. Difficult to get in and out of bed or a chair without assistance. Very distracting. With effort, it can be ignored when deeply involved in activities.   Moderately severe pain 4 Impossible to ignore for more than a few minutes. With effort, patients may still be able to manage work or participate in some social activities. Very difficult to concentrate. Signs of autonomic nervous system discharge are evident: dilated pupils (mydriasis); mild sweating (diaphoresis); sleep interference. Heart rate becomes elevated (>115 bpm). Diastolic blood pressure (lower number) rises above 100 mmHg. Patients find relief in laying down and not moving.   Severe pain 5 Intense and extremely unpleasant. Associated with frowning face and frequent crying. Pain overwhelms the senses.  Ability to do any activity or maintain social relationships becomes significantly limited. Conversation becomes difficult. Pacing back and forth is common, as getting into a comfortable position is nearly impossible. Pain wakes you up from deep sleep. Physical signs will be obvious: pupillary dilation; increased sweating;  goosebumps; brisk reflexes; cold, clammy hands and feet; nausea, vomiting or dry heaves; loss of appetite; significant sleep disturbance with inability to fall asleep or to remain asleep. When persistent, significant weight loss is observed due to the complete loss of appetite and sleep  deprivation.  Blood pressure and heart rate becomes significantly elevated. Caution: If elevated blood pressure triggers a pounding headache associated with blurred vision, then the patient should immediately seek attention at an urgent or emergency care unit, as these may be signs of an impending stroke.    Emergency Department Pain Levels (6-10/10)  Emergency Room Pain 6 Severely limiting. Requires emergency care and should not be seen or managed at an outpatient pain management facility. Communication becomes difficult and requires great effort. Assistance to reach the emergency department may be required. Facial flushing and profuse sweating along with potentially dangerous increases in heart rate and blood pressure will be evident.   Distressing pain 7 Self-care is very difficult. Assistance is required to transport, or use restroom. Assistance to reach the emergency department will be required. Tasks requiring coordination, such as bathing and getting dressed become very difficult.   Disabling pain 8 Self-care is no longer possible. At this level, pain is disabling. The individual is unable to do even the most "basic" activities such as walking, eating, bathing, dressing, transferring to a bed, or toileting. Fine motor skills are lost. It is difficult to think clearly.   Incapacitating pain 9 Pain becomes incapacitating. Thought processing is no longer possible. Difficult to remember your own name. Control of movement and coordination are lost.   The worst pain imaginable 10 At this level, most patients pass out from pain. When this level is reached, collapse of the autonomic nervous system occurs, leading to a sudden drop in blood pressure and heart rate. This in turn results in a temporary and dramatic drop in blood flow to the brain, leading to a loss of consciousness. Fainting is one of the body's self defense mechanisms. Passing out puts the brain in a calmed state and causes it to shut down  for a while, in order to begin the healing process.    Summary: 1. Refer to this scale when providing [place] with your pain level. 2. Be accurate and careful when reporting your pain level. This will help with your care. 3. Over-reporting your pain level will lead to loss of credibility. 4. Even a level of 1/10 means that there is pain and will be treated at our facility. 5. High, inaccurate reporting will be documented as "Symptom Exaggeration", leading to loss of credibility and suspicions of possible secondary gains such as obtaining more narcotics, or wanting to appear disabled, for fraudulent reasons. 6. Only pain levels of 5 or below will be seen at our facility. 7. Pain levels of 6 and above will be sent to the Emergency Department and the appointment cancelled. _____________________________________________________________________________________________

## 2016-10-21 DIAGNOSIS — Z79899 Other long term (current) drug therapy: Secondary | ICD-10-CM | POA: Insufficient documentation

## 2016-10-21 DIAGNOSIS — M4722 Other spondylosis with radiculopathy, cervical region: Secondary | ICD-10-CM | POA: Insufficient documentation

## 2016-10-21 DIAGNOSIS — G894 Chronic pain syndrome: Secondary | ICD-10-CM | POA: Insufficient documentation

## 2016-10-21 DIAGNOSIS — M5412 Radiculopathy, cervical region: Secondary | ICD-10-CM | POA: Insufficient documentation

## 2016-10-22 ENCOUNTER — Telehealth: Payer: Self-pay | Admitting: Nurse Practitioner

## 2016-10-22 ENCOUNTER — Ambulatory Visit: Payer: Medicaid Other | Attending: Pain Medicine | Admitting: Pain Medicine

## 2016-10-22 ENCOUNTER — Ambulatory Visit
Admission: RE | Admit: 2016-10-22 | Discharge: 2016-10-22 | Disposition: A | Discharge status: Home / Self Care | Payer: Medicaid Other | Source: Ambulatory Visit | Attending: Pain Medicine | Admitting: Pain Medicine

## 2016-10-22 ENCOUNTER — Encounter: Payer: Self-pay | Admitting: Nurse Practitioner

## 2016-10-22 ENCOUNTER — Other Ambulatory Visit
Admission: RE | Admit: 2016-10-22 | Discharge: 2016-10-22 | Disposition: A | Discharge status: Home / Self Care | Payer: Medicaid Other | Source: Ambulatory Visit | Attending: Pain Medicine | Admitting: Pain Medicine

## 2016-10-22 ENCOUNTER — Encounter: Payer: Self-pay | Admitting: Pain Medicine

## 2016-10-22 VITALS — BP 131/95 | HR 71 | Temp 98.3°F | Resp 16 | Ht 64.0 in | Wt 230.0 lb

## 2016-10-22 DIAGNOSIS — M25511 Pain in right shoulder: Secondary | ICD-10-CM | POA: Insufficient documentation

## 2016-10-22 DIAGNOSIS — M4722 Other spondylosis with radiculopathy, cervical region: Secondary | ICD-10-CM

## 2016-10-22 DIAGNOSIS — F1190 Opioid use, unspecified, uncomplicated: Secondary | ICD-10-CM | POA: Diagnosis not present

## 2016-10-22 DIAGNOSIS — K219 Gastro-esophageal reflux disease without esophagitis: Secondary | ICD-10-CM | POA: Diagnosis not present

## 2016-10-22 DIAGNOSIS — Z841 Family history of disorders of kidney and ureter: Secondary | ICD-10-CM | POA: Diagnosis not present

## 2016-10-22 DIAGNOSIS — Z79891 Long term (current) use of opiate analgesic: Secondary | ICD-10-CM | POA: Insufficient documentation

## 2016-10-22 DIAGNOSIS — Z8261 Family history of arthritis: Secondary | ICD-10-CM | POA: Insufficient documentation

## 2016-10-22 DIAGNOSIS — Z811 Family history of alcohol abuse and dependence: Secondary | ICD-10-CM | POA: Diagnosis not present

## 2016-10-22 DIAGNOSIS — G894 Chronic pain syndrome: Secondary | ICD-10-CM | POA: Diagnosis not present

## 2016-10-22 DIAGNOSIS — M50122 Cervical disc disorder at C5-C6 level with radiculopathy: Secondary | ICD-10-CM | POA: Insufficient documentation

## 2016-10-22 DIAGNOSIS — Z87891 Personal history of nicotine dependence: Secondary | ICD-10-CM | POA: Insufficient documentation

## 2016-10-22 DIAGNOSIS — Z825 Family history of asthma and other chronic lower respiratory diseases: Secondary | ICD-10-CM | POA: Diagnosis not present

## 2016-10-22 DIAGNOSIS — E876 Hypokalemia: Secondary | ICD-10-CM | POA: Insufficient documentation

## 2016-10-22 DIAGNOSIS — M19172 Post-traumatic osteoarthritis, left ankle and foot: Secondary | ICD-10-CM | POA: Insufficient documentation

## 2016-10-22 DIAGNOSIS — Z822 Family history of deafness and hearing loss: Secondary | ICD-10-CM | POA: Insufficient documentation

## 2016-10-22 DIAGNOSIS — M25572 Pain in left ankle and joints of left foot: Secondary | ICD-10-CM | POA: Insufficient documentation

## 2016-10-22 DIAGNOSIS — M7732 Calcaneal spur, left foot: Secondary | ICD-10-CM | POA: Insufficient documentation

## 2016-10-22 DIAGNOSIS — M12572 Traumatic arthropathy, left ankle and foot: Secondary | ICD-10-CM | POA: Diagnosis not present

## 2016-10-22 DIAGNOSIS — Z79899 Other long term (current) drug therapy: Secondary | ICD-10-CM | POA: Diagnosis not present

## 2016-10-22 DIAGNOSIS — G8929 Other chronic pain: Secondary | ICD-10-CM | POA: Insufficient documentation

## 2016-10-22 DIAGNOSIS — F419 Anxiety disorder, unspecified: Secondary | ICD-10-CM | POA: Diagnosis not present

## 2016-10-22 DIAGNOSIS — Z823 Family history of stroke: Secondary | ICD-10-CM | POA: Insufficient documentation

## 2016-10-22 DIAGNOSIS — M542 Cervicalgia: Secondary | ICD-10-CM | POA: Diagnosis present

## 2016-10-22 LAB — C-REACTIVE PROTEIN

## 2016-10-22 LAB — COMPREHENSIVE METABOLIC PANEL
ALBUMIN: 4.4 g/dL (ref 3.5–5.0)
ALT: 37 U/L (ref 14–54)
ANION GAP: 9 (ref 5–15)
AST: 44 U/L — ABNORMAL HIGH (ref 15–41)
Alkaline Phosphatase: 83 U/L (ref 38–126)
BUN: 11 mg/dL (ref 6–20)
CHLORIDE: 107 mmol/L (ref 101–111)
CO2: 24 mmol/L (ref 22–32)
Calcium: 9.1 mg/dL (ref 8.9–10.3)
Creatinine, Ser: 1.02 mg/dL — ABNORMAL HIGH (ref 0.44–1.00)
GFR calc non Af Amer: >60 mL/min (ref >60)
GLUCOSE: 121 mg/dL — AB (ref 65–99)
POTASSIUM: 3 mmol/L — AB (ref 3.5–5.1)
SODIUM: 140 mmol/L (ref 135–145)
Total Bilirubin: 0.8 mg/dL (ref 0.3–1.2)
Total Protein: 7.9 g/dL (ref 6.5–8.1)

## 2016-10-22 LAB — MAGNESIUM: MAGNESIUM: 2.1 mg/dL (ref 1.7–2.4)

## 2016-10-22 LAB — SEDIMENTATION RATE: Sed Rate: 19 mm/hr (ref 0–20)

## 2016-10-22 LAB — VITAMIN B12: VITAMIN B 12: 268 pg/mL (ref 180–914)

## 2016-10-22 NOTE — Progress Notes (Signed)
Results were reviewed and found to be: mildly abnormal  No acute injury or pathology identified  Review would suggest interventional pain management techniques may be of benefit 

## 2016-10-22 NOTE — Progress Notes (Signed)
Safety precautions to be maintained throughout the outpatient stay will include: orient to surroundings, keep bed in low position, maintain call bell within reach at all times, provide assistance with transfer out of bed and ambulation.  

## 2016-10-22 NOTE — Patient Instructions (Addendum)
GENERAL RISKS AND COMPLICATIONS  What are the risk, side effects and possible complications? Generally speaking, most procedures are safe.  However, with any procedure there are risks, side effects, and the possibility of complications.  The risks and complications are dependent upon the sites that are lesioned, or the type of nerve block to be performed.  The closer the procedure is to the spine, the more serious the risks are.  Great care is taken when placing the radio frequency needles, block needles or lesioning probes, but sometimes complications can occur. 1. Infection: Any time there is an injection through the skin, there is a risk of infection.  This is why sterile conditions are used for these blocks.  There are four possible types of infection. 1. Localized skin infection. 2. Central Nervous System Infection-This can be in the form of Meningitis, which can be deadly. 3. Epidural Infections-This can be in the form of an epidural abscess, which can cause pressure inside of the spine, causing compression of the spinal cord with subsequent paralysis. This would require an emergency surgery to decompress, and there are no guarantees that the patient would recover from the paralysis. 4. Discitis-This is an infection of the intervertebral discs.  It occurs in about 1% of discography procedures.  It is difficult to treat and it may lead to surgery.        2. Pain: the needles have to go through skin and soft tissues, will cause soreness.       3. Damage to internal structures:  The nerves to be lesioned may be near blood vessels or    other nerves which can be potentially damaged.       4. Bleeding: Bleeding is more common if the patient is taking blood thinners such as  aspirin, Coumadin, Ticiid, Plavix, etc., or if he/she have some genetic predisposition  such as hemophilia. Bleeding into the spinal canal can cause compression of the spinal  cord with subsequent paralysis.  This would require an  emergency surgery to  decompress and there are no guarantees that the patient would recover from the  paralysis.       5. Pneumothorax:  Puncturing of a lung is a possibility, every time a needle is introduced in  the area of the chest or upper back.  Pneumothorax refers to free air around the  collapsed lung(s), inside of the thoracic cavity (chest cavity).  Another two possible  complications related to a similar event would include: Hemothorax and Chylothorax.   These are variations of the Pneumothorax, where instead of air around the collapsed  lung(s), you may have blood or chyle, respectively.       6. Spinal headaches: They may occur with any procedures in the area of the spine.       7. Persistent CSF (Cerebro-Spinal Fluid) leakage: This is a rare problem, but may occur  with prolonged intrathecal or epidural catheters either due to the formation of a fistulous  track or a dural tear.       8. Nerve damage: By working so close to the spinal cord, there is always a possibility of  nerve damage, which could be as serious as a permanent spinal cord injury with  paralysis.       9. Death:  Although rare, severe deadly allergic reactions known as "Anaphylactic  reaction" can occur to any of the medications used.      10. Worsening of the symptoms:  We can always make thing worse.    What are the chances of something like this happening? Chances of any of this occuring are extremely low.  By statistics, you have more of a chance of getting killed in a motor vehicle accident: while driving to the hospital than any of the above occurring .  Nevertheless, you should be aware that they are possibilities.  In general, it is similar to taking a shower.  Everybody knows that you can slip, hit your head and get killed.  Does that mean that you should not shower again?  Nevertheless always keep in mind that statistics do not mean anything if you happen to be on the wrong side of them.  Even if a procedure has a 1  (one) in a 1,000,000 (million) chance of going wrong, it you happen to be that one..Also, keep in mind that by statistics, you have more of a chance of having something go wrong when taking medications.  Who should not have this procedure? If you are on a blood thinning medication (e.g. Coumadin, Plavix, see list of "Blood Thinners"), or if you have an active infection going on, you should not have the procedure.  If you are taking any blood thinners, please inform your physician.  How should I prepare for this procedure?  Do not eat or drink anything at least six hours prior to the procedure.  Bring a driver with you .  It cannot be a taxi.  Come accompanied by an adult that can drive you back, and that is strong enough to help you if your legs get weak or numb from the local anesthetic.  Take all of your medicines the morning of the procedure with just enough water to swallow them.  If you have diabetes, make sure that you are scheduled to have your procedure done first thing in the morning, whenever possible.  If you have diabetes, take only half of your insulin dose and notify our nurse that you have done so as soon as you arrive at the clinic.  If you are diabetic, but only take blood sugar pills (oral hypoglycemic), then do not take them on the morning of your procedure.  You may take them after you have had the procedure.  Do not take aspirin or any aspirin-containing medications, at least eleven (11) days prior to the procedure.  They may prolong bleeding.  Wear loose fitting clothing that may be easy to take off and that you would not mind if it got stained with Betadine or blood.  Do not wear any jewelry or perfume  Remove any nail coloring.  It will interfere with some of our monitoring equipment.  NOTE: Remember that this is not meant to be interpreted as a complete list of all possible complications.  Unforeseen problems may occur.  BLOOD THINNERS The following drugs  contain aspirin or other products, which can cause increased bleeding during surgery and should not be taken for 2 weeks prior to and 1 week after surgery.  If you should need take something for relief of minor pain, you may take acetaminophen which is found in Tylenol,m Datril, Anacin-3 and Panadol. It is not blood thinner. The products listed below are.  Do not take any of the products listed below in addition to any listed on your instruction sheet.  A.P.C or A.P.C with Codeine Codeine Phosphate Capsules #3 Ibuprofen Ridaura  ABC compound Congesprin Imuran rimadil  Advil Cope Indocin Robaxisal  Alka-Seltzer Effervescent Pain Reliever and Antacid Coricidin or Coricidin-D  Indomethacin Rufen    Alka-Seltzer plus Cold Medicine Cosprin Ketoprofen S-A-C Tablets  Anacin Analgesic Tablets or Capsules Coumadin Korlgesic Salflex  Anacin Extra Strength Analgesic tablets or capsules CP-2 Tablets Lanoril Salicylate  Anaprox Cuprimine Capsules Levenox Salocol  Anexsia-D Dalteparin Magan Salsalate  Anodynos Darvon compound Magnesium Salicylate Sine-off  Ansaid Dasin Capsules Magsal Sodium Salicylate  Anturane Depen Capsules Marnal Soma  APF Arthritis pain formula Dewitt's Pills Measurin Stanback  Argesic Dia-Gesic Meclofenamic Sulfinpyrazone  Arthritis Bayer Timed Release Aspirin Diclofenac Meclomen Sulindac  Arthritis pain formula Anacin Dicumarol Medipren Supac  Analgesic (Safety coated) Arthralgen Diffunasal Mefanamic Suprofen  Arthritis Strength Bufferin Dihydrocodeine Mepro Compound Suprol  Arthropan liquid Dopirydamole Methcarbomol with Aspirin Synalgos  ASA tablets/Enseals Disalcid Micrainin Tagament  Ascriptin Doan's Midol Talwin  Ascriptin A/D Dolene Mobidin Tanderil  Ascriptin Extra Strength Dolobid Moblgesic Ticlid  Ascriptin with Codeine Doloprin or Doloprin with Codeine Momentum Tolectin  Asperbuf Duoprin Mono-gesic Trendar  Aspergum Duradyne Motrin or Motrin IB Triminicin  Aspirin  plain, buffered or enteric coated Durasal Myochrisine Trigesic  Aspirin Suppositories Easprin Nalfon Trillsate  Aspirin with Codeine Ecotrin Regular or Extra Strength Naprosyn Uracel  Atromid-S Efficin Naproxen Ursinus  Auranofin Capsules Elmiron Neocylate Vanquish  Axotal Emagrin Norgesic Verin  Azathioprine Empirin or Empirin with Codeine Normiflo Vitamin E  Azolid Emprazil Nuprin Voltaren  Bayer Aspirin plain, buffered or children's or timed BC Tablets or powders Encaprin Orgaran Warfarin Sodium  Buff-a-Comp Enoxaparin Orudis Zorpin  Buff-a-Comp with Codeine Equegesic Os-Cal-Gesic   Buffaprin Excedrin plain, buffered or Extra Strength Oxalid   Bufferin Arthritis Strength Feldene Oxphenbutazone   Bufferin plain or Extra Strength Feldene Capsules Oxycodone with Aspirin   Bufferin with Codeine Fenoprofen Fenoprofen Pabalate or Pabalate-SF   Buffets II Flogesic Panagesic   Buffinol plain or Extra Strength Florinal or Florinal with Codeine Panwarfarin   Buf-Tabs Flurbiprofen Penicillamine   Butalbital Compound Four-way cold tablets Penicillin   Butazolidin Fragmin Pepto-Bismol   Carbenicillin Geminisyn Percodan   Carna Arthritis Reliever Geopen Persantine   Carprofen Gold's salt Persistin   Chloramphenicol Goody's Phenylbutazone   Chloromycetin Haltrain Piroxlcam   Clmetidine heparin Plaquenil   Cllnoril Hyco-pap Ponstel   Clofibrate Hydroxy chloroquine Propoxyphen         Before stopping any of these medications, be sure to consult the physician who ordered them.  Some, such as Coumadin (Warfarin) are ordered to prevent or treat serious conditions such as "deep thrombosis", "pumonary embolisms", and other heart problems.  The amount of time that you may need off of the medication may also vary with the medication and the reason for which you were taking it.  If you are taking any of these medications, please make sure you notify your pain physician before you undergo any  procedures. Pain Score  Introduction: The pain score used by this practice is the Verbal Numerical Rating Scale (VNRS-11). This is an 11-point scale. It is for adults and children 10 years or older. There are significant differences in how the pain score is reported, used, and applied. Forget everything you learned in the past and learn this scoring system.  General Information: The scale should reflect your current level of pain. Unless you are specifically asked for the level of your worst pain, or your average pain. If you are asked for one of these two, then it should be understood that it is over the past 24 hours.  Basic Activities of Daily Living (ADL): Personal hygiene, dressing, eating, transferring, and using restroom.  Instructions: Most patients tend to report their  level of pain as a combination of two factors, their physical pain and their psychosocial pain. This last one is also known as "suffering" and it is reflection of how physical pain affects you socially and psychologically. From now on, report them separately. From this point on, when asked to report your pain level, report only your physical pain. Use the following table for reference.  Pain Clinic Pain Levels (0-5/10)  Pain Level Score Description  No Pain 0   Mild pain 1 Nagging, annoying, but does not interfere with basic activities of daily living (ADL). Patients are able to eat, bathe, get dressed, toileting (being able to get on and off the toilet and perform personal hygiene functions), transfer (move in and out of bed or a chair without assistance), and maintain continence (able to control bladder and bowel functions). Blood pressure and heart rate are unaffected. A normal heart rate for a healthy adult ranges from 60 to 100 bpm (beats per minute).   Mild to moderate pain 2 Noticeable and distracting. Impossible to hide from other people. More frequent flare-ups. Still possible to adapt and function close to normal. It  can be very annoying and may have occasional stronger flare-ups. With discipline, patients may get used to it and adapt.   Moderate pain 3 Interferes significantly with activities of daily living (ADL). It becomes difficult to feed, bathe, get dressed, get on and off the toilet or to perform personal hygiene functions. Difficult to get in and out of bed or a chair without assistance. Very distracting. With effort, it can be ignored when deeply involved in activities.   Moderately severe pain 4 Impossible to ignore for more than a few minutes. With effort, patients may still be able to manage work or participate in some social activities. Very difficult to concentrate. Signs of autonomic nervous system discharge are evident: dilated pupils (mydriasis); mild sweating (diaphoresis); sleep interference. Heart rate becomes elevated (>115 bpm). Diastolic blood pressure (lower number) rises above 100 mmHg. Patients find relief in laying down and not moving.   Severe pain 5 Intense and extremely unpleasant. Associated with frowning face and frequent crying. Pain overwhelms the senses.  Ability to do any activity or maintain social relationships becomes significantly limited. Conversation becomes difficult. Pacing back and forth is common, as getting into a comfortable position is nearly impossible. Pain wakes you up from deep sleep. Physical signs will be obvious: pupillary dilation; increased sweating; goosebumps; brisk reflexes; cold, clammy hands and feet; nausea, vomiting or dry heaves; loss of appetite; significant sleep disturbance with inability to fall asleep or to remain asleep. When persistent, significant weight loss is observed due to the complete loss of appetite and sleep deprivation.  Blood pressure and heart rate becomes significantly elevated. Caution: If elevated blood pressure triggers a pounding headache associated with blurred vision, then the patient should immediately seek attention at an  urgent or emergency care unit, as these may be signs of an impending stroke.    Emergency Department Pain Levels (6-10/10)  Emergency Room Pain 6 Severely limiting. Requires emergency care and should not be seen or managed at an outpatient pain management facility. Communication becomes difficult and requires great effort. Assistance to reach the emergency department may be required. Facial flushing and profuse sweating along with potentially dangerous increases in heart rate and blood pressure will be evident.   Distressing pain 7 Self-care is very difficult. Assistance is required to transport, or use restroom. Assistance to reach the emergency department will be  required. Tasks requiring coordination, such as bathing and getting dressed become very difficult.   Disabling pain 8 Self-care is no longer possible. At this level, pain is disabling. The individual is unable to do even the most "basic" activities such as walking, eating, bathing, dressing, transferring to a bed, or toileting. Fine motor skills are lost. It is difficult to think clearly.   Incapacitating pain 9 Pain becomes incapacitating. Thought processing is no longer possible. Difficult to remember your own name. Control of movement and coordination are lost.   The worst pain imaginable 10 At this level, most patients pass out from pain. When this level is reached, collapse of the autonomic nervous system occurs, leading to a sudden drop in blood pressure and heart rate. This in turn results in a temporary and dramatic drop in blood flow to the brain, leading to a loss of consciousness. Fainting is one of the body's self defense mechanisms. Passing out puts the brain in a calmed state and causes it to shut down for a while, in order to begin the healing process.    Summary: 1. Refer to this scale when providing [place] with your pain level. 2. Be accurate and careful when reporting your pain level. This will help with your  care. 3. Over-reporting your pain level will lead to loss of credibility. 4. Even a level of 1/10 means that there is pain and will be treated at our facility. 5. High, inaccurate reporting will be documented as "Symptom Exaggeration", leading to loss of credibility and suspicions of possible secondary gains such as obtaining more narcotics, or wanting to appear disabled, for fraudulent reasons. 6. Only pain levels of 5 or below will be seen at our facility. 7. Pain levels of 6 and above will be sent to the Emergency Department and the appointment cancelled. _____________________________________________________________________________________________

## 2016-10-24 NOTE — Telephone Encounter (Signed)
Left message to get follow up with PCP or GYN to check Potassium. Also instructed her to look at notes on my chart to see values and to eat foods high in Potassium.

## 2016-10-26 LAB — 25-HYDROXY VITAMIN D LCMS D2+D3: 25-Hydroxy, Vitamin D-2: <1 ng/mL

## 2016-10-26 LAB — 25-HYDROXYVITAMIN D LCMS D2+D3
25-HYDROXY, VITAMIN D-3: 17 ng/mL
25-HYDROXY, VITAMIN D: 17 ng/mL — AB

## 2016-10-26 LAB — COMPLIANCE DRUG ANALYSIS, UR

## 2016-10-28 ENCOUNTER — Other Ambulatory Visit: Payer: Self-pay | Admitting: Nurse Practitioner

## 2016-10-28 ENCOUNTER — Encounter: Payer: Self-pay | Admitting: Nurse Practitioner

## 2016-10-28 DIAGNOSIS — E559 Vitamin D deficiency, unspecified: Secondary | ICD-10-CM | POA: Insufficient documentation

## 2016-10-28 MED ORDER — VITAMIN D (ERGOCALCIFEROL) 1.25 MG (50000 UNIT) PO CAPS: ORAL_CAPSULE | ORAL | 0 refills | Status: DC

## 2016-10-28 NOTE — Telephone Encounter (Signed)
-----   Message from Barbette Merinorystal M King, NP sent at 10/28/2016  9:02 AM EDT ----- Regarding: Vitamin D Please call pt and make her aware that her Vitamin D is 17. I have sent her in weekly vitamin D for 12 weeks. She will then start OTC daily. Recheck annually. Thanks

## 2016-10-28 NOTE — Telephone Encounter (Signed)
Not working today --called home and left message about Vitamin D and prescription at the pharm and then will need OTC and have rechecked annually

## 2016-11-18 ENCOUNTER — Telehealth: Payer: Self-pay | Admitting: Obstetrics and Gynecology

## 2016-11-18 NOTE — Telephone Encounter (Signed)
Appt made for 11/19/16

## 2016-11-18 NOTE — Telephone Encounter (Signed)
Having pressure and frequency she thinks she may have another UTI  Please call

## 2016-11-19 ENCOUNTER — Encounter: Payer: Self-pay | Admitting: Obstetrics and Gynecology

## 2016-11-19 ENCOUNTER — Ambulatory Visit (INDEPENDENT_AMBULATORY_CARE_PROVIDER_SITE_OTHER): Payer: Medicaid Other | Admitting: Obstetrics and Gynecology

## 2016-11-19 VITALS — BP 130/83 | HR 71 | Ht 64.0 in | Wt 234.6 lb

## 2016-11-19 DIAGNOSIS — R3915 Urgency of urination: Secondary | ICD-10-CM

## 2016-11-19 DIAGNOSIS — N3001 Acute cystitis with hematuria: Secondary | ICD-10-CM

## 2016-11-19 LAB — POCT URINALYSIS DIPSTICK
Bilirubin, UA: NEGATIVE
Blood, UA: NEGATIVE
GLUCOSE UA: NEGATIVE
Ketones, UA: NEGATIVE
NITRITE UA: NEGATIVE
Protein, UA: NEGATIVE
Spec Grav, UA: 1.015 (ref 1.010–1.025)
Urobilinogen, UA: 0.2 E.U./dL
pH, UA: 5 (ref 5.0–8.0)

## 2016-11-19 MED ORDER — NITROFURANTOIN MONOHYD MACRO 100 MG PO CAPS: 100.0000 mg | ORAL_CAPSULE | Freq: Two times a day (BID) | ORAL | 0 refills | Status: AC

## 2016-11-19 NOTE — Progress Notes (Signed)
HPI:      [name] is a 36 y.o. [phone] who LMP was Patient's last menstrual period was 10/25/2016 (approximate).  Subjective:   She presents today With urinary complaints of urgency frequency dysuria possible blood in her urine. She is taking OCPs as directed     Hx: The following portions of the patient's history were reviewed and updated as appropriate:             She  has a past medical history of Anxiety; Arthritis; GERD (gastroesophageal reflux disease); Gestational diabetes; Pregnancy induced hypertension; Seizures (HCC); and Vaginal Pap smear, abnormal. She  does not have any pertinent problems on file. She  has a past surgical history that includes Ankle fracture surgery and Dilation and evacuation (N/A, 09/04/2016). Her family history includes Alcohol abuse in her father; Arthritis in her maternal grandmother and mother; Asthma in her mother; COPD in her maternal grandmother; Depression in her maternal aunt; Diabetes in her maternal grandfather; Hearing loss in her paternal grandmother; Heart disease in her paternal grandfather; Hyperlipidemia in her maternal grandfather, maternal grandmother, and mother; Hypertension in her maternal grandfather, maternal grandmother, and mother; Kidney disease in her father; Stroke in her mother; Varicose Veins in her maternal grandmother and paternal grandmother; Vision loss in her mother. She  reports that she has quit smoking. She has never used smokeless tobacco. She reports that she does not drink alcohol or use drugs. She is allergic to tramadol.       Review of Systems:  Review of Systems  Constitutional: Denied constitutional symptoms, night sweats, recent illness, fatigue, fever, insomnia and weight loss.  Eyes: Denied eye symptoms, eye pain, photophobia, vision change and visual disturbance.  Ears/Nose/Throat/Neck: Denied ear, nose, throat or neck symptoms, hearing loss, nasal discharge, sinus congestion and sore throat.   Cardiovascular: Denied cardiovascular symptoms, arrhythmia, chest pain/pressure, edema, exercise intolerance, orthopnea and palpitations.  Respiratory: Denied pulmonary symptoms, asthma, pleuritic pain, productive sputum, cough, dyspnea and wheezing.  Gastrointestinal: Denied, gastro-esophageal reflux, melena, nausea and vomiting.  Genitourinary: See HPI for additional information.  Musculoskeletal: Denied musculoskeletal symptoms, stiffness, swelling, muscle weakness and myalgia.  Dermatologic: Denied dermatology symptoms, rash and scar.  Neurologic: Denied neurology symptoms, dizziness, headache, neck pain and syncope.  Psychiatric: Denied psychiatric symptoms, anxiety and depression.  Endocrine: Denied endocrine symptoms including hot flashes and night sweats.   Meds:   Current Outpatient Prescriptions on File Prior to Visit  Medication Sig Dispense Refill  . acetaminophen (TYLENOL) 325 MG tablet Take 650 mg by mouth every 4 (four) hours as needed for mild pain or moderate pain.     Marland Kitchen. desogestrel-ethinyl estradiol (MIRCETTE) 0.15-0.02/0.01 MG (21/5) tablet Take 1 tablet by mouth at bedtime. 1 Package 3  . Vitamin D, Ergocalciferol, (DRISDOL) 50000 units CAPS capsule Take 1 capsule (50,000 Units total) by mouth 1 times a week x 12 weeks. 12 capsule 0  . ALPRAZolam (XANAX) 0.5 MG tablet Take 1 tablet (0.5 mg total) by mouth 2 (two) times daily. (Patient not taking: Reported on 11/19/2016) 14 tablet 0  . hydrOXYzine (ATARAX/VISTARIL) 50 MG tablet Take 50 mg by mouth 3 (three) times daily as needed for anxiety.     No current facility-administered medications on file prior to visit.     Objective:     Vitals:   11/19/16 1552  BP: 130/83  Pulse: 71              See urine dip  Assessment:    [id number]  Patient Active Problem List   Diagnosis Date Noted  . Vitamin D deficiency 10/28/2016  . Chronic ankle pain (Location of Primary Source of Pain) (Left) 10/22/2016  . Long term  (current) use of opiate analgesic 10/22/2016  . Long term prescription opiate use 10/22/2016  . Opiate use 10/22/2016  . Traumatic arthritis of ankle (Left) 10/22/2016  . Post-traumatic osteoarthritis of ankle (Left) 10/22/2016  . Low serum potassium level 10/22/2016  . Chronic pain syndrome 10/21/2016  . Cervical spondylosis with radiculopathy 10/21/2016  . Long term prescription benzodiazepine use 10/21/2016  . Cervical radiculopathy, chronic 10/21/2016  . Anxiety 10/16/2016  . GERD (gastroesophageal reflux disease) 10/16/2016  . Seizures (HCC) 10/16/2016  . Herniation of intervertebral disc at C5-C6 level 12/01/2015  . History of fracture of left ankle 12/01/2015  . Chronic shoulder pain (Location of Secondary source of pain) (Right) 12/01/2015  . Chronic neck pain 10/09/2015     1. Urgency of urination   2. Acute cystitis with hematuria        Plan:            1.  Will treat with Macrobid advised increased fluids AZO as needed.  2.  Patient to follow up for OCPs in 4-6 weeks. Orders Orders Placed This Encounter  Procedures  . POCT urinalysis dipstick          F/U  Return for We will contact her with any abnormal test results, Pt to contact us if symptoms worsen. I spent 15 minutes with this patient of which greater than 50% was spent discussing UTI, symptoms, treatment.  Elonda Husky, M.D. 11/19/2016 4:21 PM

## 2016-12-17 ENCOUNTER — Encounter: Payer: Medicaid Other | Admitting: Obstetrics and Gynecology

## 2016-12-24 ENCOUNTER — Encounter: Payer: Medicaid Other | Admitting: Obstetrics and Gynecology

## 2017-01-03 ENCOUNTER — Encounter: Payer: Self-pay | Admitting: *Deleted

## 2017-01-03 ENCOUNTER — Ambulatory Visit
Admission: EM | Admit: 2017-01-03 | Discharge: 2017-01-03 | Disposition: A | Discharge status: Home / Self Care | Payer: Medicaid Other | Attending: Family | Admitting: Family

## 2017-01-03 DIAGNOSIS — M79672 Pain in left foot: Secondary | ICD-10-CM | POA: Diagnosis not present

## 2017-01-03 DIAGNOSIS — M25572 Pain in left ankle and joints of left foot: Secondary | ICD-10-CM

## 2017-01-03 MED ORDER — PREDNISONE 10 MG PO TABS: ORAL_TABLET | ORAL | 0 refills | Status: DC

## 2017-01-03 MED ORDER — KETOROLAC TROMETHAMINE 60 MG/2ML IM SOLN
60.0000 mg | Freq: Once | INTRAMUSCULAR | Status: AC
  Administered 2017-01-03: 60 mg via INTRAMUSCULAR

## 2017-01-03 MED ORDER — LIDOCAINE 5 % EX PTCH: 1.0000 | MEDICATED_PATCH | CUTANEOUS | 0 refills | Status: DC

## 2017-01-03 NOTE — ED Triage Notes (Signed)
Gradual onset left foot and ankle pain over past 3 days. Denies injury. Previous hx of left ankle fx.

## 2017-01-03 NOTE — Discharge Instructions (Signed)
Ice, elevation, Ace wrap as discussed.  Lidocaine patches once daily. Start 4 day prednisone taper.  If there is no improvement in your symptoms, or if there is any worsening of symptoms, or if you have any additional concerns, please return for re-evaluation; or, if we are closed, consider going to the Emergency Room for evaluation if symptoms urgent.

## 2017-01-03 NOTE — ED Provider Notes (Signed)
CSN: [id number]     Arrival date & time 01/03/17  1241 History   First MD Initiated Contact with Patient 01/03/17 1408     Chief Complaint  Patient presents with  . Ankle Pain  . Foot Pain   (Consider location/radiation/quality/duration/timing/severity/associated sxs/prior Treatment) Chief complaint of left top of foot 1day, worsening.  Tyelonol, ice, ace wrap generally works. Pain usually in left ankle.  No twisting. No wounds, lacerations, fever, N, v, chills.   Couple of months ago treated with prednisone by pcp which helped. In past acute ankle pain she was given Toradol injection which worked well. She would like another injection today.  History of left ankle fracture surgery with plate, screws.   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   Sees primary care provider in 2 weeks.       Recently evaluated by rheumatology polyarthralgia 11/2016  Evaluated by pain management 5/18, Dr Larwance RoteNavriera.   No ckd No longer taking celebrex.    Past Medical History:  Diagnosis Date  . Anxiety   . Arthritis   . GERD (gastroesophageal reflux disease)   . Gestational diabetes    borderline with daughter  . Pregnancy induced hypertension    flucuates  . Seizures (HCC)    only one due to a reaction to tramadol  . Vaginal Pap smear, abnormal    had colposcopy   Past Surgical History:  Procedure Laterality Date  . ANKLE FRACTURE SURGERY    . DILATION AND EVACUATION N/A 09/04/2016   Procedure: DILATATION AND EVACUATION;  Surgeon: Linzie Collinavid James Evans, MD;  Location: [facility] ORS;  Service: Gynecology;  Laterality: N/A;   Family History  Problem Relation Age of Onset  . Arthritis Mother   . Asthma Mother   . Hyperlipidemia Mother   . Hypertension Mother   . Stroke Mother   . Vision loss Mother   . Alcohol abuse Father   . Kidney disease Father   . Depression Maternal Aunt   . Arthritis Maternal Grandmother   . COPD Maternal Grandmother   . Hyperlipidemia Maternal Grandmother   . Hypertension  Maternal Grandmother   . Varicose Veins Maternal Grandmother   . Diabetes Maternal Grandfather   . Hyperlipidemia Maternal Grandfather   . Hypertension Maternal Grandfather   . Hearing loss Paternal Grandmother   . Varicose Veins Paternal Grandmother   . Heart disease Paternal Grandfather    Social History  Substance Use Topics  . Smoking status: Former Games developermoker  . Smokeless tobacco: Never Used  . Alcohol use No   OB History    Gravida Para Term Preterm AB Living   4 1 1   2 1    SAB TAB Ectopic Multiple Live Births   1 1     1      Review of Systems  Constitutional: Negative for chills and fever.  Respiratory: Negative for cough.   Cardiovascular: Negative for chest pain and palpitations.  Gastrointestinal: Negative for nausea and vomiting.  Musculoskeletal: Positive for joint swelling.    Allergies  Tramadol  Home Medications   Prior to Admission medications   Medication Sig Start Date End Date Taking? Authorizing Provider  acetaminophen (TYLENOL) 325 MG tablet Take 650 mg by mouth every 4 (four) hours as needed for mild pain or moderate pain.    Yes [provider]  desogestrel-ethinyl estradiol (MIRCETTE) 0.15-0.02/0.01 MG (21/5) tablet Take 1 tablet by mouth at bedtime. 09/11/16  Yes Linzie CollinEvans, David James, MD  omeprazole (PRILOSEC) 20 MG capsule Omeprazole 20MG  Oral Capsule  Delayed Release QTY: 30 capsule Days: 30 Refills: 2  Written: 10/15/16 Patient Instructions: once a day 10/15/16  Yes [provider]  Vitamin D, Ergocalciferol, (DRISDOL) 50000 units CAPS capsule Take 1 capsule (50,000 Units total) by mouth 1 times a week x 12 weeks. 10/28/16  Yes Barbette MerinoKing, Crystal M, NP  ALPRAZolam Prudy Feeler(XANAX) 0.5 MG tablet Take 1 tablet (0.5 mg total) by mouth 2 (two) times daily. Patient not taking: Reported on 11/19/2016 08/26/16   Doreene Burkehompson, Annie, CNM  celecoxib (CELEBREX) 200 MG capsule CeleBREX 200MG  Oral Capsule QTY: 30 capsule Days: 30 Refills: 6  Written: 10/15/16 Patient  Instructions: once a day 10/15/16   [provider]  hydrOXYzine (ATARAX/VISTARIL) 50 MG tablet Take 50 mg by mouth 3 (three) times daily as needed for anxiety.    [provider]  lidocaine (LIDODERM) 5 % Place 1 patch onto the skin daily. Remove & Discard patch within 12 hours. 01/03/17   Allegra GranaArnett, Arlenne Kimbley G, FNP  predniSONE (DELTASONE) 10 MG tablet Take 40 mg by mouth on day 1, then taper 10 mg daily until gone 01/03/17   Allegra GranaArnett, Marrell Dicaprio G, FNP   Meds Ordered and Administered this Visit   Medications  ketorolac (TORADOL) injection 60 mg (60 mg Intramuscular Given 01/03/17 1440)    BP (!) 125/92 (BP Location: Left Arm)   Pulse 73   Temp 98.2 F (36.8 C) (Oral)   Resp 16   Ht 5\' 4"  (1.626 m)   Wt 250 lb (113.4 kg)   SpO2 100%   BMI 42.91 kg/m  No data found.   Physical Exam  Constitutional: She appears well-developed and well-nourished.  Eyes: Conjunctivae are normal.  Cardiovascular: Normal rate, regular rhythm, normal heart sounds and normal pulses.    No LE edema. No palpable cords or masses. No erythema or increased warmth. No asymmetry in calf size when compared bilaterally LE warm and palpable pedal pulses.   Pulmonary/Chest: Effort normal and breath sounds normal. She has no wheezes. She has no rhonchi. She has no rales.  Musculoskeletal:       Left ankle: She exhibits decreased range of motion. She exhibits no swelling, no ecchymosis, no deformity and normal pulse. No tenderness.       Left foot: There is tenderness. There is normal range of motion, no bony tenderness and no swelling.       Feet:  Limping gait. Pain as described by patient were on the dorsal aspect left foot is noted on diagram. No ecchymosis, edema, deformity, any tenderness. I reduction in range of motion with plantar and dorsiflexion of left ankle.    Neurological: She is alert.  Sensation intact bilateral lower extremities  Skin: Skin is warm and dry.  Psychiatric: She has a  normal mood and affect. Her speech is normal and behavior is normal. Thought content normal.  Vitals reviewed.   Urgent Care Course     Procedures (including critical care time)  Labs Review Labs Reviewed - No data to display  Imaging Review No results found.     MDM   1. Pain of joint of left ankle and foot    Patient has complex history left ankle injury, status post fixation with rods, screws. Acute on chronic pain. No recent injury. Patient politely declines any x-ray imaging today. Jointly agreed that short taper of prednisone, lidocaine patches, and Toradol injection given in clinic appropriate. Advised to follow-up with PCP for ongoing chronic pain management. Return precautions given    Sarahmarie Leavey,  Lyn Records, FNP 01/03/17 [phone]

## 2017-02-19 ENCOUNTER — Other Ambulatory Visit
Admit: 2017-02-19 | Discharge: 2017-02-19 | Disposition: A | Discharge status: Home / Self Care | Attending: Family Medicine | Admitting: Family Medicine

## 2017-02-19 NOTE — ED Notes (Signed)
Pt arrives with Officer Maximino Sarin from [place] [employer] for forensic blood draw, blood drawn per protocol by Odis Hollingshead RN, betadine used for skin prep, blood specimens obtained using kit provided by PD

## 2017-02-24 ENCOUNTER — Other Ambulatory Visit: Payer: Self-pay | Admitting: Internal Medicine

## 2017-02-24 DIAGNOSIS — M25572 Pain in left ankle and joints of left foot: Secondary | ICD-10-CM

## 2017-03-04 ENCOUNTER — Ambulatory Visit: Payer: Medicaid Other | Attending: Internal Medicine

## 2017-03-05 ENCOUNTER — Encounter: Payer: Self-pay | Admitting: Obstetrics and Gynecology

## 2017-03-05 ENCOUNTER — Ambulatory Visit (INDEPENDENT_AMBULATORY_CARE_PROVIDER_SITE_OTHER): Payer: Medicaid Other | Admitting: Obstetrics and Gynecology

## 2017-03-05 VITALS — BP 116/75 | HR 85 | Ht 64.0 in | Wt 232.7 lb

## 2017-03-05 DIAGNOSIS — O09291 Supervision of pregnancy with other poor reproductive or obstetric history, first trimester: Secondary | ICD-10-CM

## 2017-03-05 DIAGNOSIS — F419 Anxiety disorder, unspecified: Secondary | ICD-10-CM

## 2017-03-05 DIAGNOSIS — Z3201 Encounter for pregnancy test, result positive: Secondary | ICD-10-CM

## 2017-03-05 LAB — POCT URINE PREGNANCY: PREG TEST UR: POSITIVE — AB

## 2017-03-05 NOTE — Progress Notes (Signed)
    GYNECOLOGY CLINIC PROGRESS NOTE  Subjective:    Morgan Ali is a 36 y.o. G66P1021 female who presents for evaluation of menstrual irregularity. She believes she could be pregnant. Pregnancy is desired. Sexual Activity: single partner, contraception: OCP (estrogen/progesterone). Current symptoms also include: fatigue and positive home pregnancy test. Last period was abnormal, was approximately 2-3 months ago.  Cannot recall LMP.  Of note, patient request an ultrasound today to find out how far she is.  Notes that she is very anxious, as she has had 2 miscarriages in the past (most recent one in March). Denies pelvic pain or bleeding. Notes that she took a pregnancy test this morning, which is when she discovered she was pregnant.    The following portions of the patient's history were reviewed and updated as appropriate: allergies, current medications, past family history, past medical history, past social history, past surgical history and problem list.  Review of Systems Pertinent items noted in HPI and remainder of comprehensive ROS otherwise negative.     Objective:    BP 116/75 (BP Location: Left Arm, Patient Position: Sitting, Cuff Size: Normal)   Pulse 85   Ht  (1.626 m)   Wt 232 lb 11.2 oz (105.6 kg)   BMI 39.94 kg/m  General: no acute distress    Lab Review Urine HCG: positive    Assessment:    Absence of menstruation.    Anxiety H/o miscarriage  Plan:   - Pregnancy Test: Positive: EDC: unknown due to unknown LMP. Briefly discussed pre-natal care options. Encouraged well-balanced diet, plenty of rest when needed, pre-natal vitamins daily and walking for exercise. Discussed self-help for nausea, avoiding OTC medications until consulting provider or pharmacist, other than Tylenol as needed, minimal caffeine (1-2 cups daily) and avoiding alcohol. Will schedule NOB intake and OB visit once gestational age determined.  - Patient very adamant about receiving  ultrasound.  Discussed that we did not have any availability for same day ultrasound today, as well as the fact that since we did not know how far along she is, an ultrasound may not be helpful if she is to early to see anything and could lead to further anxiety.  Patient notes that she "feels" that she is at least [redacted] weeks pregnant.  Advised that we could perform a BHCG to assess approximate gestational age.  Can schedule for ultrasound in 2 days if BHCG is greater than 1500. Patient ok with plan.  - Patient discontinued all of her medications at discovery of pregnancy. Advised that she can continue her Atarax for her anxiety.    A total of 15 minutes were spent face-to-face with the patient during this encounter and over half of that time dealt with counseling and coordination of care.   Hildred Laser, MD [facility]

## 2017-03-06 ENCOUNTER — Encounter: Payer: Self-pay | Admitting: Obstetrics and Gynecology

## 2017-03-06 ENCOUNTER — Other Ambulatory Visit: Payer: Self-pay | Admitting: Obstetrics and Gynecology

## 2017-03-06 ENCOUNTER — Telehealth: Payer: Self-pay | Admitting: Obstetrics and Gynecology

## 2017-03-06 DIAGNOSIS — N926 Irregular menstruation, unspecified: Secondary | ICD-10-CM

## 2017-03-06 LAB — BETA HCG QUANT (REF LAB): hCG Quant: 407 m[IU]/mL

## 2017-03-06 NOTE — Telephone Encounter (Signed)
Pt called back to get HCG results. I advised that Dr. Valentino Saxon had left for the day and it can take 24 for 48 hours for messages to be returned. Pt stated that she needed to know what her levels were today b/c she stated that she was advised her [place] might get rescheduled based on her levels. Pt stated that she couldn't wait last minute if her [place] was going to be rescheduled b/c she has to have child care and she didn't want to be out the child care money to not have the appt. Pt requested if another provider in the office could review the results. I spoke with Melody and Melody advised to cancel the [place] and have pt come in tomorrow morning 03/07/17 to have her levels rechecked. I called back and advised her and scheduled pt for labs at 815 am on 03/07/17. Thanks TNP

## 2017-03-06 NOTE — Telephone Encounter (Signed)
Patient called again and is very upset and argumentative because no one has called her back and she was told that someone would call her within an hour with her results. I informed patient that she would be notified as soon as the MD reviews the results and they will also be available on My Chart. She stated that she was told that she would get the results and did she need to come up here and get them. I again told her that she would get a call as soon as the information is available from the doctor. She hung up on me.

## 2017-03-06 NOTE — Telephone Encounter (Signed)
Pt request a nurse return her call to give her HCG lab results from 03/05/17. Pt stated that she can't see the results on MyChart. Please advise. Thanks TNP

## 2017-03-06 NOTE — Telephone Encounter (Signed)
Patient called requesting her Beta results. Thanks

## 2017-03-06 NOTE — Telephone Encounter (Signed)
Please advise... Beta is 407

## 2017-03-07 ENCOUNTER — Other Ambulatory Visit: Payer: Medicaid Other

## 2017-03-07 ENCOUNTER — Encounter: Payer: Self-pay | Admitting: Obstetrics and Gynecology

## 2017-03-07 ENCOUNTER — Other Ambulatory Visit: Payer: Self-pay

## 2017-03-07 DIAGNOSIS — O09299 Supervision of pregnancy with other poor reproductive or obstetric history, unspecified trimester: Secondary | ICD-10-CM

## 2017-03-07 DIAGNOSIS — N926 Irregular menstruation, unspecified: Secondary | ICD-10-CM

## 2017-03-07 LAB — BETA HCG QUANT (REF LAB): hCG Quant: 807 m[IU]/mL

## 2017-03-10 ENCOUNTER — Ambulatory Visit: Payer: Medicaid Other

## 2017-03-10 ENCOUNTER — Encounter (INDEPENDENT_AMBULATORY_CARE_PROVIDER_SITE_OTHER): Payer: Medicaid Other | Admitting: Podiatry

## 2017-03-10 DIAGNOSIS — M25572 Pain in left ankle and joints of left foot: Secondary | ICD-10-CM

## 2017-03-10 NOTE — Progress Notes (Signed)
This encounter was created in error - please disregard.

## 2017-03-11 ENCOUNTER — Encounter: Payer: Self-pay | Admitting: Obstetrics and Gynecology

## 2017-03-11 ENCOUNTER — Ambulatory Visit (INDEPENDENT_AMBULATORY_CARE_PROVIDER_SITE_OTHER): Payer: Medicaid Other | Admitting: Obstetrics and Gynecology

## 2017-03-11 VITALS — BP 110/77 | HR 82 | Wt 231.2 lb

## 2017-03-11 DIAGNOSIS — F419 Anxiety disorder, unspecified: Secondary | ICD-10-CM

## 2017-03-11 DIAGNOSIS — O99341 Other mental disorders complicating pregnancy, first trimester: Secondary | ICD-10-CM | POA: Diagnosis not present

## 2017-03-11 MED ORDER — VITAFOL ULTRA 29-0.6-0.4-200 MG PO CAPS: 1.0000 | ORAL_CAPSULE | Freq: Every day | ORAL | 6 refills | Status: DC

## 2017-03-11 NOTE — Progress Notes (Signed)
HPI:      [name] is a 36 y.o. [id number] who LMP was Patient's last menstrual period was 10/25/2016 (approximate).  Subjective:   She presents today With concerns regarding her current pregnancy. Her last menses miscarriage and she is very anxious and concerned that this is a good pregnancy. She had a Quant done on 2 occasions last week and it is rising appropriately. She has no bleeding or cramping. She is taking prenatal vitamins as directed.    Hx: The following portions of the patient's history were reviewed and updated as appropriate:             She  has a past medical history of Anxiety; Arthritis; GERD (gastroesophageal reflux disease); Gestational diabetes; Pregnancy induced hypertension; Seizures (HCC); and Vaginal Pap smear, abnormal. She  does not have any pertinent problems on file. She  has a past surgical history that includes Ankle fracture surgery and Dilation and evacuation (N/A, 09/04/2016). Her family history includes Alcohol abuse in her father; Arthritis in her maternal grandmother and mother; Asthma in her mother; COPD in her maternal grandmother; Depression in her maternal aunt; Diabetes in her maternal grandfather; Hearing loss in her paternal grandmother; Heart disease in her paternal grandfather; Hyperlipidemia in her maternal grandfather, maternal grandmother, and mother; Hypertension in her maternal grandfather, maternal grandmother, and mother; Kidney disease in her father; Stroke in her mother; Varicose Veins in her maternal grandmother and paternal grandmother; Vision loss in her mother. She  reports that she has quit smoking. She has never used smokeless tobacco. She reports that she does not drink alcohol or use drugs. She is allergic to tramadol.       Review of Systems:  Review of Systems  Constitutional: Denied constitutional symptoms, night sweats, recent illness, fatigue, fever, insomnia and weight loss.  Eyes: Denied eye symptoms, eye pain,  photophobia, vision change and visual disturbance.  Ears/Nose/Throat/Neck: Denied ear, nose, throat or neck symptoms, hearing loss, nasal discharge, sinus congestion and sore throat.  Cardiovascular: Denied cardiovascular symptoms, arrhythmia, chest pain/pressure, edema, exercise intolerance, orthopnea and palpitations.  Respiratory: Denied pulmonary symptoms, asthma, pleuritic pain, productive sputum, cough, dyspnea and wheezing.  Gastrointestinal: Denied, gastro-esophageal reflux, melena, nausea and vomiting.  Genitourinary: Denied genitourinary symptoms including symptomatic vaginal discharge, pelvic relaxation issues, and urinary complaints.  Musculoskeletal: Denied musculoskeletal symptoms, stiffness, swelling, muscle weakness and myalgia.  Dermatologic: Denied dermatology symptoms, rash and scar.  Neurologic: Denied neurology symptoms, dizziness, headache, neck pain and syncope.  Psychiatric: Denied psychiatric symptoms, anxiety and depression.  Endocrine: Denied endocrine symptoms including hot flashes and night sweats.   Meds:   No current outpatient prescriptions on file prior to visit.   No current facility-administered medications on file prior to visit.     Objective:     Vitals:   03/11/17 1605  BP: 110/77  Pulse: 82                Assessment:    G5P1021 Patient Active Problem List   Diagnosis Date Noted  . Vitamin D deficiency 10/28/2016  . Chronic ankle pain (Location of Primary Source of Pain) (Left) 10/22/2016  . Long term (current) use of opiate analgesic 10/22/2016  . Long term prescription opiate use 10/22/2016  . Opiate use 10/22/2016  . Traumatic arthritis of ankle (Left) 10/22/2016  . Post-traumatic osteoarthritis of ankle (Left) 10/22/2016  . Low serum potassium level 10/22/2016  . Chronic pain syndrome 10/21/2016  . Cervical spondylosis with radiculopathy 10/21/2016  . Long  term prescription benzodiazepine use 10/21/2016  . Cervical  radiculopathy, chronic 10/21/2016  . Anxiety 10/16/2016  . GERD (gastroesophageal reflux disease) 10/16/2016  . Seizures (HCC) 10/16/2016  . Herniation of intervertebral disc at C5-C6 level 12/01/2015  . History of fracture of left ankle 12/01/2015  . Chronic shoulder pain (Location of Secondary source of pain) (Right) 12/01/2015  . Chronic neck pain 10/09/2015     1. Anxiety during pregnancy in first trimester, antepartum     Patient is very concerned regarding possibility of miscarriage. She would like to confirm the pregnancy with ultrasound as soon as possible.  She has no current signs or symptoms of miscarriage and her quants have been doubling appropriately.   Plan:            1.  Patient to keep her ultrasound appointment as scheduled.  2.  Nurse visit in 4 weeks  3.  New OB visit in 6 weeks  4.  I have reassured her regarding the possibility of miscarriage. We have discussed statistics regarding proceeding loss rates and reoccurrence of miscarriage. We have discussed her anxiety regarding miscarriage in some detail and I believe she feels better. Orders No orders of the defined types were placed in this encounter.   No orders of the defined types were placed in this encounter.     F/U  Return in about 6 weeks (around 04/22/2017). I spent 16 minutes with this patient of which greater than 50% was spent discussing pregnancy loss, repeat pregnancy loss, scheduling of ultrasounds and test for pregnancy, anxiety during the first trimester.  Elonda Husky, M.D. 03/11/2017 4:32 PM

## 2017-03-13 ENCOUNTER — Telehealth: Payer: Self-pay | Admitting: Obstetrics and Gynecology

## 2017-03-13 NOTE — Telephone Encounter (Signed)
Left a message on pts voice mail and sent a Mychart message also- pt may make appointment for a Beta tomorrow.

## 2017-03-13 NOTE — Telephone Encounter (Signed)
Patient would like to have another HCG   Please call

## 2017-03-14 ENCOUNTER — Other Ambulatory Visit: Payer: Medicaid Other

## 2017-03-25 ENCOUNTER — Other Ambulatory Visit: Payer: Self-pay | Admitting: Obstetrics and Gynecology

## 2017-03-25 ENCOUNTER — Ambulatory Visit (INDEPENDENT_AMBULATORY_CARE_PROVIDER_SITE_OTHER): Payer: Medicaid Other

## 2017-03-25 DIAGNOSIS — O09291 Supervision of pregnancy with other poor reproductive or obstetric history, first trimester: Secondary | ICD-10-CM

## 2017-03-31 ENCOUNTER — Telehealth: Payer: Self-pay | Admitting: Obstetrics and Gynecology

## 2017-03-31 NOTE — Telephone Encounter (Signed)
Patient called and stated that she would like a prescription of Dicligious to help with her severe nausea and vomiting. Patient stated that she is not able to keep any food or liquids down. No other information was disclosed. Please advise.

## 2017-03-31 NOTE — Telephone Encounter (Signed)
vm full unable to leave message

## 2017-04-01 MED ORDER — DOXYLAMINE-PYRIDOXINE 10-10 MG PO TBEC: 10.0000 mg | DELAYED_RELEASE_TABLET | Freq: Every day | ORAL | 1 refills | Status: DC

## 2017-04-01 NOTE — Telephone Encounter (Signed)
Pt states for the last week or so she has had severe nausea and vomiting. Pt had diclegis sample at home. She took it and now is better. Able to hold liquids and small meals. ERX  diclegis. Also gave pt components  of diclegis incase too expense.

## 2017-04-08 ENCOUNTER — Ambulatory Visit (INDEPENDENT_AMBULATORY_CARE_PROVIDER_SITE_OTHER): Payer: Medicaid Other | Admitting: Obstetrics and Gynecology

## 2017-04-08 VITALS — BP 109/76 | HR 77 | Wt 228.5 lb

## 2017-04-08 DIAGNOSIS — Z3A09 9 weeks gestation of pregnancy: Secondary | ICD-10-CM

## 2017-04-08 NOTE — Progress Notes (Signed)
Morgan Ali presents for NOB nurse interview visit. [facility]  P-[facility] . Pregnancy education material explained and given. _No__ cats in the home. NOB labs ordered. (TSH/HbgA1c due to Increased BMI). HIV labs and Drug screen were explained optional and she did not decline. Drug screen ordered. PNV encouraged. Genetic screening options discussed. Genetic testing: Unsure.  Pt may discuss with provider. Pt will follow up with Morgan Ali, she already has appointment set up with him.

## 2017-04-09 LAB — CBC WITH DIFFERENTIAL/PLATELET
BASOS ABS: 0 10*3/uL (ref 0.0–0.2)
Basos: 0 %
EOS (ABSOLUTE): 0.1 10*3/uL (ref 0.0–0.4)
Eos: 1 %
Hematocrit: 37.8 % (ref 34.0–46.6)
Hemoglobin: 12.9 g/dL (ref 11.1–15.9)
IMMATURE GRANS (ABS): 0 10*3/uL (ref 0.0–0.1)
Immature Granulocytes: 0 %
LYMPHS: 34 %
Lymphocytes Absolute: 2.5 10*3/uL (ref 0.7–3.1)
MCH: 30.6 pg (ref 26.6–33.0)
MCHC: 34.1 g/dL (ref 31.5–35.7)
MCV: 90 fL (ref 79–97)
Monocytes Absolute: 0.5 10*3/uL (ref 0.1–0.9)
Monocytes: 7 %
NEUTROS ABS: 4.2 10*3/uL (ref 1.4–7.0)
Neutrophils: 58 %
Platelets: 360 10*3/uL (ref 150–379)
RBC: 4.22 x10E6/uL (ref 3.77–5.28)
RDW: 14.6 % (ref 12.3–15.4)
WBC: 7.4 10*3/uL (ref 3.4–10.8)

## 2017-04-09 LAB — ANTIBODY SCREEN: ANTIBODY SCREEN: NEGATIVE

## 2017-04-09 LAB — RPR: RPR Ser Ql: NONREACTIVE

## 2017-04-09 LAB — URINALYSIS, ROUTINE W REFLEX MICROSCOPIC
Bilirubin, UA: NEGATIVE
Glucose, UA: NEGATIVE
Ketones, UA: NEGATIVE
Leukocytes, UA: NEGATIVE
NITRITE UA: NEGATIVE
Protein, UA: NEGATIVE
RBC, UA: NEGATIVE
Specific Gravity, UA: 1.021 (ref 1.005–1.030)
Urobilinogen, Ur: 0.2 mg/dL (ref 0.2–1.0)
pH, UA: 6.5 (ref 5.0–7.5)

## 2017-04-09 LAB — RUBELLA SCREEN: Rubella Antibodies, IGG: 3.22 index (ref >0.99)

## 2017-04-09 LAB — HIV ANTIBODY (ROUTINE TESTING W REFLEX): HIV Screen 4th Generation wRfx: NONREACTIVE

## 2017-04-09 LAB — HEPATITIS B SURFACE ANTIGEN: HEP B S AG: NEGATIVE

## 2017-04-09 LAB — ABO AND RH: RH TYPE: POSITIVE

## 2017-04-09 LAB — VARICELLA ZOSTER ANTIBODY, IGG: Varicella zoster IgG: 1251 index (ref >165)

## 2017-04-09 LAB — TSH: TSH: 1.12 u[IU]/mL (ref 0.450–4.500)

## 2017-04-09 LAB — HEMOGLOBIN A1C
Est. average glucose Bld gHb Est-mCnc: 108 mg/dL
Hgb A1c MFr Bld: 5.4 % (ref 4.8–5.6)

## 2017-04-10 LAB — URINE CULTURE

## 2017-04-10 LAB — GC/CHLAMYDIA PROBE AMP
Chlamydia trachomatis, NAA: NEGATIVE
NEISSERIA GONORRHOEAE BY PCR: NEGATIVE

## 2017-04-13 LAB — MONITOR DRUG PROFILE 14(MW)
Amphetamine Scrn, Ur: NEGATIVE ng/mL
BARBITURATE SCREEN URINE: NEGATIVE ng/mL
BENZODIAZEPINE SCREEN, URINE: NEGATIVE ng/mL
Buprenorphine, Urine: NEGATIVE ng/mL
CANNABINOIDS UR QL SCN: NEGATIVE ng/mL
CREATININE(CRT), U: 143.7 mg/dL (ref 20.0–300.0)
Cocaine (Metab) Scrn, Ur: NEGATIVE ng/mL
FENTANYL, URINE: NEGATIVE pg/mL
METHADONE SCREEN, URINE: NEGATIVE ng/mL
Meperidine Screen, Urine: NEGATIVE ng/mL
Ph of Urine: 6.6 (ref 4.5–8.9)
Phencyclidine Qn, Ur: NEGATIVE ng/mL
Propoxyphene Scrn, Ur: NEGATIVE ng/mL
SPECIFIC GRAVITY: 1.021
TRAMADOL SCREEN, URINE: NEGATIVE ng/mL

## 2017-04-13 LAB — OXYCODONE/OXYMORPHONE CONFIRM
OXYCODONE/OXYMORPH: POSITIVE — AB
OXYCODONE: 1416 ng/mL
OXYCODONE: POSITIVE — AB
OXYMORPHONE CONFIRM: 2430 ng/mL
OXYMORPHONE: POSITIVE — AB

## 2017-04-13 LAB — OPIATES CONFIRMATION, URINE: Opiates: NEGATIVE ng/mL

## 2017-04-22 ENCOUNTER — Ambulatory Visit (INDEPENDENT_AMBULATORY_CARE_PROVIDER_SITE_OTHER): Payer: Medicaid Other | Admitting: Obstetrics and Gynecology

## 2017-04-22 ENCOUNTER — Encounter: Payer: Medicaid Other | Admitting: Obstetrics and Gynecology

## 2017-04-22 ENCOUNTER — Encounter: Payer: Self-pay | Admitting: Obstetrics and Gynecology

## 2017-04-22 ENCOUNTER — Telehealth: Payer: Self-pay | Admitting: Obstetrics and Gynecology

## 2017-04-22 VITALS — BP 126/80 | HR 72 | Wt 233.5 lb

## 2017-04-22 DIAGNOSIS — O09521 Supervision of elderly multigravida, first trimester: Secondary | ICD-10-CM

## 2017-04-22 DIAGNOSIS — Z3A11 11 weeks gestation of pregnancy: Secondary | ICD-10-CM

## 2017-04-22 LAB — POCT URINALYSIS DIPSTICK
Bilirubin, UA: NEGATIVE
Blood, UA: NEGATIVE
Glucose, UA: NEGATIVE
Ketones, UA: NEGATIVE
LEUKOCYTES UA: NEGATIVE
NITRITE UA: NEGATIVE
PROTEIN UA: NEGATIVE
Spec Grav, UA: 1.01 (ref 1.010–1.025)
UROBILINOGEN UA: 0.2 U/dL
pH, UA: 7 (ref 5.0–8.0)

## 2017-04-22 MED ORDER — DOXYLAMINE-PYRIDOXINE ER 20-20 MG PO TBCR: 20.0000 mg | EXTENDED_RELEASE_TABLET | Freq: Three times a day (TID) | ORAL | 1 refills | Status: DC

## 2017-04-22 NOTE — Telephone Encounter (Signed)
Diclijest was sent in - she need the Bojesta sent in   Please call

## 2017-04-22 NOTE — Progress Notes (Signed)
NOB: No complaints today.  Desires MaterniT 21 and AFP at 15 weeks.  Taking Bongesta for occasional nausea and vomiting of pregnancy.

## 2017-04-22 NOTE — Addendum Note (Signed)
Addended by: Brooke DareSICK, Yaritzy Huser L on: 04/22/2017 01:48 PM   Modules accepted: Orders

## 2017-04-22 NOTE — Addendum Note (Signed)
Addended by: Brooke DareSICK, Dixie Coppa L on: 04/22/2017 03:55 PM   Modules accepted: Orders

## 2017-04-22 NOTE — Telephone Encounter (Signed)
Patient called and stated that she spoke with a nurse in regards to her prescription that was pre-approved (Bonjesta 20mg  Tablet) but when the patient went to her pharmacy the Pharmacy did not have her medication. The patient needs a nurse to call her back to confirm that her medication will be sent in. Please advise.

## 2017-04-22 NOTE — Progress Notes (Signed)
Physical examination General NAD, Conversant  HEENT Atraumatic; Op clear with mmm.  Normo-cephalic. Pupils reactive. Anicteric sclerae  Thyroid/Neck Smooth without nodularity or enlargement. Normal ROM.  Neck Supple.  Skin No rashes, lesions or ulceration. Normal palpated skin turgor. No nodularity.  Breasts: No masses or discharge.  Symmetric.  No axillary adenopathy.  Lungs: Clear to auscultation.No rales or wheezes. Normal Respiratory effort, no retractions.  Heart: NSR.  No murmurs or rubs appreciated. No periferal edema  Abdomen: Soft.  Non-tender.  No masses.  No HSM. No hernia  Extremities: Moves all appropriately.  Normal ROM for age. No lymphadenopathy.  Neuro: Oriented to PPT.  Normal mood. Normal affect.     Pelvic:   Vulva: Normal appearance.  No lesions.  Vagina: No lesions or abnormalities noted.  Support: Normal pelvic support.  Urethra No masses tenderness or scarring.  Meatus Normal size without lesions or prolapse.  Cervix: Normal appearance.  No lesions.  Anus: Normal exam.  No lesions.  Perineum: Normal exam.  No lesions.        Bimanual   Adnexae: No masses.  Non-tender to palpation.  Uterus: Enlarged. 11 wks  Non-tender.  Mobile.  AV.  Adnexae: No masses.  Non-tender to palpation.  Cul-de-sac: Negative for abnormality.  Adnexae: No masses.  Non-tender to palpation.         Pelvimetry   Diagonal: Reached.  Spines: Average.  Sacrum: Concave.  Pubic Arch: Normal.   Exam limited by patient's morbid obesity

## 2017-04-24 LAB — PAP IG, CT-NG, RFX HPV ASCU
Chlamydia, Nuc. Acid Amp: NEGATIVE
Gonococcus by Nucleic Acid Amp: NEGATIVE
PAP SMEAR COMMENT: 0

## 2017-04-27 LAB — MATERNIT 21 PLUS CORE, BLOOD
Chromosome 13: NEGATIVE
Chromosome 18: NEGATIVE
Chromosome 21: NEGATIVE
Y CHROMOSOME: NOT DETECTED

## 2017-05-15 ENCOUNTER — Telehealth: Payer: Self-pay | Admitting: *Deleted

## 2017-05-15 NOTE — Telephone Encounter (Signed)
Patient called and states that she is [redacted] wks pregnant and is having migraines. Patient took Imitrex in the past and is wondering if she can take it while she is pregnant. Please call . Her contact number is [phone]. Please advise. Thank you

## 2017-05-16 ENCOUNTER — Telehealth: Payer: Self-pay

## 2017-05-16 NOTE — Telephone Encounter (Signed)
Message left for pt to check mychart message.

## 2017-05-20 ENCOUNTER — Encounter: Payer: Medicaid Other | Admitting: Obstetrics and Gynecology

## 2017-05-27 ENCOUNTER — Encounter: Payer: Medicaid Other | Admitting: Obstetrics and Gynecology

## 2017-07-01 ENCOUNTER — Ambulatory Visit (INDEPENDENT_AMBULATORY_CARE_PROVIDER_SITE_OTHER): Payer: Medicaid Other | Admitting: Obstetrics and Gynecology

## 2017-07-01 ENCOUNTER — Encounter: Payer: Medicaid Other | Admitting: Obstetrics and Gynecology

## 2017-07-01 ENCOUNTER — Encounter: Payer: Self-pay | Admitting: Obstetrics and Gynecology

## 2017-07-01 ENCOUNTER — Other Ambulatory Visit (INDEPENDENT_AMBULATORY_CARE_PROVIDER_SITE_OTHER): Payer: Medicaid Other

## 2017-07-01 VITALS — BP 125/81 | HR 81 | Wt 245.0 lb

## 2017-07-01 DIAGNOSIS — Z3482 Encounter for supervision of other normal pregnancy, second trimester: Secondary | ICD-10-CM | POA: Diagnosis not present

## 2017-07-01 DIAGNOSIS — Z3402 Encounter for supervision of normal first pregnancy, second trimester: Secondary | ICD-10-CM | POA: Diagnosis not present

## 2017-07-01 DIAGNOSIS — Z23 Encounter for immunization: Secondary | ICD-10-CM | POA: Diagnosis not present

## 2017-07-01 LAB — POCT URINALYSIS DIPSTICK
BILIRUBIN UA: NEGATIVE
Glucose, UA: NEGATIVE
KETONES UA: NEGATIVE
Leukocytes, UA: NEGATIVE
Nitrite, UA: NEGATIVE
PH UA: 7 (ref 5.0–8.0)
Protein, UA: NEGATIVE
RBC UA: NEGATIVE
Spec Grav, UA: <=1.005 — AB (ref 1.010–1.025)
UROBILINOGEN UA: 0.2 U/dL

## 2017-07-01 NOTE — Progress Notes (Signed)
ROB- Pt does not have any complaints

## 2017-07-01 NOTE — Progress Notes (Signed)
ROB: Patient denies complaints.  MaterniT21 normal, declines AFP today.  For anatomy scan today.

## 2017-07-06 ENCOUNTER — Other Ambulatory Visit: Payer: Self-pay

## 2017-07-06 ENCOUNTER — Ambulatory Visit
Admission: EM | Admit: 2017-07-06 | Discharge: 2017-07-06 | Disposition: A | Discharge status: Home / Self Care | Payer: Medicaid Other | Attending: Emergency Medicine | Admitting: Emergency Medicine

## 2017-07-06 DIAGNOSIS — R5383 Other fatigue: Secondary | ICD-10-CM

## 2017-07-06 DIAGNOSIS — R0981 Nasal congestion: Secondary | ICD-10-CM

## 2017-07-06 DIAGNOSIS — R51 Headache: Secondary | ICD-10-CM | POA: Diagnosis not present

## 2017-07-06 DIAGNOSIS — J0140 Acute pansinusitis, unspecified: Secondary | ICD-10-CM

## 2017-07-06 LAB — RAPID INFLUENZA A&B ANTIGENS
Influenza A (ARMC): NEGATIVE
Influenza B (ARMC): NEGATIVE

## 2017-07-06 MED ORDER — FLUTICASONE PROPIONATE 50 MCG/ACT NA SUSP: 2.0000 | Freq: Every day | NASAL | 0 refills | Status: DC

## 2017-07-06 NOTE — ED Triage Notes (Addendum)
Pt reports she got her flu shot on Thursday and since then she has had congestion and her body "feels heavier."  Pt here for a work note for today. Pt is [redacted] weeks pregnant

## 2017-07-06 NOTE — Discharge Instructions (Signed)
Take the medication as written. Start Mucinex to keep the mucous thin and to decongest you.  Return to the ER if you get worse, have a fever >100.4, or for any concerns. You may take gram of tylenol up to 3-4 times a day as needed for pain. This is an effective combination for pain.  Most sinus infections are viral and do not need antibiotics unless you have a high fever, have had this for 10 days, or you get better and then get sick again. Use a NeilMed sinus rinse as often as you want to to reduce nasal congestion. Follow the directions on the box.   Go to www.goodrx.com to look up your medications. This will give you a list of where you can find your prescriptions at the most affordable prices. Or you can ask the pharmacist what the cash price is. This is frequently cheaper than going through insurance.

## 2017-07-06 NOTE — ED Provider Notes (Signed)
HPI  SUBJECTIVE:  Morgan Ali is a 37 y.o. female who currently [redacted] weeks pregnant presents with fatigue, greenish nasal congestion, rhinorrhea, postnasal drip, sinus pain and pressure and a frontal headache starting yesterday.  She reports a cough productive of the same material as her nasal congestion and body aches.  She denies fevers, upper dental pain, ear pain.  No wheezing, chest pain, shortness of breath.  No sore throat.  She states that she had 2 coworkers with the flu.  No allergy type symptoms.  She tried emergency and Tylenol without improvement in her symptoms.  No aggravating factors.  No antibiotics in the past month.  No antipyretic in the past 6-8 hours.  She got a flu shot 4 days ago.  She is G4 P1 SAB 2 she is receiving regular prenatal care.  She denies contractions, leakage of fluid, vaginal bleeding.  She states that she is feeling baby move.  She has a past medical history of sinusitis.  No history of diabetes, hypertension, asthma, allergies.  PMD: Dr. Logan Bores at [facility].   Past Medical History:  Diagnosis Date  . Anxiety   . Arthritis   . GERD (gastroesophageal reflux disease)   . Gestational diabetes    borderline with daughter  . Pregnancy induced hypertension    flucuates  . Seizures (HCC)    only one due to a reaction to tramadol  . Vaginal Pap smear, abnormal    had colposcopy    Past Surgical History:  Procedure Laterality Date  . ANKLE FRACTURE SURGERY    . DILATION AND EVACUATION N/A 09/04/2016   Procedure: DILATATION AND EVACUATION;  Surgeon: Linzie Collin, MD;  Location: [facility] ORS;  Service: Gynecology;  Laterality: N/A;    Family History  Problem Relation Age of Onset  . Arthritis Mother   . Asthma Mother   . Hyperlipidemia Mother   . Hypertension Mother   . Stroke Mother   . Vision loss Mother   . Alcohol abuse Father   . Kidney disease Father   . Depression Maternal Aunt   . Arthritis Maternal Grandmother   . COPD Maternal  Grandmother   . Hyperlipidemia Maternal Grandmother   . Hypertension Maternal Grandmother   . Varicose Veins Maternal Grandmother   . Diabetes Maternal Grandfather   . Hyperlipidemia Maternal Grandfather   . Hypertension Maternal Grandfather   . Hearing loss Paternal Grandmother   . Varicose Veins Paternal Grandmother   . Heart disease Paternal Grandfather     Social History   Tobacco Use  . Smoking status: Former Games developer  . Smokeless tobacco: Never Used  Substance Use Topics  . Alcohol use: No  . Drug use: No    No current facility-administered medications for this encounter.   Current Outpatient Medications:  .  Biotin 1 MG CAPS, Take 1 tablet by mouth daily., Disp: , Rfl:  .  fluticasone (FLONASE) 50 MCG/ACT nasal spray, Place 2 sprays into both nostrils daily., Disp: 16 g, Rfl: 0 .  folic acid (FOLVITE) 1 MG tablet, Take 1 mg by mouth daily., Disp: , Rfl:  .  Prenat-Fe Poly-Methfol-FA-DHA (VITAFOL ULTRA) 29-0.6-0.4-200 MG CAPS, Take 1 tablet by mouth daily., Disp: 30 capsule, Rfl: 6  Allergies  Allergen Reactions  . Tramadol Other (See Comments)    seizure     ROS  As noted in HPI.   Physical Exam  BP (!) 152/81 (BP Location: Left Arm)   Pulse 98   Temp 98 F (  36.7 C) (Oral)   Resp 20   Ht 5\' 3"  (1.6 m)   Wt 245 lb (111.1 kg)   LMP 01/31/2017   SpO2 97%   BMI 43.40 kg/m   Constitutional: Well developed, well nourished, no acute distress Eyes:  EOMI, conjunctiva normal bilaterally HENT: Normocephalic, atraumatic,mucus membranes moist.  Clear nasal congestion.  Erythematous, swollen turbinates.  Positive maxillary and frontal sinus tenderness.  Positive cobblestoning and postnasal drip.   Respiratory: Normal inspiratory effort Cardiovascular: Normal rate GI: nondistended skin: No rash, skin intact Musculoskeletal: no deformities Neurologic: Alert & oriented x 3, no focal neuro deficits Psychiatric: Speech and behavior appropriate   ED  Course   Medications - No data to display  Orders Placed This Encounter  Procedures  . Rapid Influenza A&B Antigens ([facility] only)    Standing Status:   Standing    Number of Occurrences:   1  . Droplet precaution    Standing Status:   Standing    Number of Occurrences:   1    Results for orders placed or performed during the hospital encounter of 07/06/17 (from the past 24 hour(s))  Rapid Influenza A&B Antigens ([facility] only)     Status: None   Collection Time: 07/06/17  4:11 PM  Result Value Ref Range   Influenza A ([facility]) NEGATIVE NEGATIVE   Influenza B ([facility]) NEGATIVE NEGATIVE   No results found.  ED Clinical Impression  Acute non-recurrent pansinusitis   ED Assessment/Plan  Most likely viral sinusitis.  Advised saline nasal irrigation, Flonase, regular Mucinex, Tylenol 1 g 3-4 times a day as needed for pain.  Checked to make sure that these medicines are safe in pregnancy through epocrates.  Discussed with her that this is most likely viral and there are no indications for antibiotics at this time.  She may return here in 9 days, for double sickening, or if she has fevers above 102 and we can reconsider antibiotics at that time.  We will check an influenza because it would change management.  Also writing a work note for today, tomorrow and the next day.  Follow-up with PMD as needed.  Rapid flu negative.  Plan as above.  Meds ordered this encounter  Medications  . fluticasone (FLONASE) 50 MCG/ACT nasal spray    Sig: Place 2 sprays into both nostrils daily.    Dispense:  16 g    Refill:  0    *This clinic note was created using Scientist, clinical (histocompatibility and immunogenetics)Dragon dictation software. Therefore, there may be occasional mistakes despite careful proofreading.   ?   Domenick GongMortenson, Onofrio Klemp, MD 07/06/17 1644

## 2017-07-06 NOTE — ED Notes (Signed)
FETAL HEART TONES 145 by doppler

## 2017-07-09 ENCOUNTER — Telehealth: Payer: Self-pay

## 2017-07-09 NOTE — Telephone Encounter (Signed)
Called to follow up with patient since visit here at [facility]. Spoke with pt, reports improvement.Patient instructed to call back with any questions or concerns. MAH  

## 2017-07-29 ENCOUNTER — Ambulatory Visit (INDEPENDENT_AMBULATORY_CARE_PROVIDER_SITE_OTHER): Payer: Medicaid Other | Admitting: Obstetrics and Gynecology

## 2017-07-29 VITALS — BP 124/84 | HR 88 | Wt 253.5 lb

## 2017-07-29 DIAGNOSIS — Z113 Encounter for screening for infections with a predominantly sexual mode of transmission: Secondary | ICD-10-CM

## 2017-07-29 DIAGNOSIS — Z131 Encounter for screening for diabetes mellitus: Secondary | ICD-10-CM

## 2017-07-29 DIAGNOSIS — M7989 Other specified soft tissue disorders: Secondary | ICD-10-CM

## 2017-07-29 DIAGNOSIS — Z130 Encounter for screening for diseases of the blood and blood-forming organs and certain disorders involving the immune mechanism: Secondary | ICD-10-CM

## 2017-07-29 DIAGNOSIS — Z3482 Encounter for supervision of other normal pregnancy, second trimester: Secondary | ICD-10-CM

## 2017-07-29 LAB — POCT URINALYSIS DIPSTICK
Bilirubin, UA: NEGATIVE
Blood, UA: NEGATIVE
Glucose, UA: NEGATIVE
KETONES UA: NEGATIVE
Leukocytes, UA: NEGATIVE
NITRITE UA: NEGATIVE
Protein, UA: NEGATIVE
UROBILINOGEN UA: 0.2 U/dL
pH, UA: 7.5 (ref 5.0–8.0)

## 2017-07-29 NOTE — Patient Instructions (Addendum)
What You Need to Know About Female Sterilization Female sterilization is surgery to prevent pregnancy. In this surgery, the fallopian tubes are either blocked or closed off. This prevents eggs from reaching the uterus so that the eggs cannot be fertilized by sperm and you cannot get pregnant. Sterilization is permanent. It should only be done if you are sure that you do not want to be able to have children. What are the sterilization surgery options? There are several kinds of female sterilization surgeries. They include:  Laparoscopic tubal ligation. In this surgery, the fallopian tubes are tied off, sealed with heat, or blocked with a clip, ring, or clamp. A small portion of each fallopian tube may also be removed. This surgery is done through several small cuts (incisions).  Postpartum tubal ligation. This is also called a mini-laparotomy. This surgery is done right after childbirth or 1 or 2 days after childbirth. In this surgery, the fallopian tubes are tied off, sealed with heat, or blocked with a clip, ring, or clamp. A small portion of each fallopian tube may also be removed. The surgery is done through a single incision.  Hysteroscopic sterilization. In this surgery, a tiny, spring-like coil is inserted through the cervix and uterus into the fallopian tubes. The coil causes scarring, which blocks the tubes. After the surgery, contraception should be used for 3 months to allow the scar tissue to form completely.  Is sterilization safe? Generally, sterilization is safe. Complications are rare. However, there are risks. They include:  Bleeding.  Infection.  Reaction to medicine used during the procedure.  Injury to surrounding organs.  Failure of the procedure.  How effective is sterilization? Sterilization is nearly 100% effective, but it can fail. Also, the fallopian tubes can grow back together over time. If this happens, you will be able to get pregnant again. Women who have had  this procedure have a higher chance of having an ectopic pregnancy. An ectopic pregnancy is a pregnancy that happens outside of the uterus. This kind of pregnancy is unsuccessful and can lead to serious bleeding if it is not treated. What are the benefits?  It is usually effective for a lifetime.  It is usually safe.  It does not have the drawbacks of other types of birth control: That means: ? Your hormones are not affected. Because of this, your menstrual periods, sexual desire, and sexual performance will not be affected. ? There are no side effects. What are the drawbacks?  If you change your mind and decide that you want to have children, you may not be able to. Sterilization may be reversed, but a reversal is not always successful.  It does not provide protection against STDs (sexually transmitted diseases).  It increases the chance of having an ectopic pregnancy. This information is not intended to replace advice given to you by your health care provider. Make sure you discuss any questions you have with your health care provider. Document Released: 11/13/2007 Document Revised: 01/18/2016 Document Reviewed: 02/21/2015 Elsevier Interactive Patient Education  2018 Elsevier Inc.  

## 2017-07-29 NOTE — Progress Notes (Signed)
ROB: Patient notes some swelling of feet bilaterally, mostly after work. Advised on elevating feet at night, compression hose.  Discussed f/u growth scan at 32 weeks due to newly diagnosed morbid obesity. For 28 week labs next visit and repeat UDS.  Considering BTL vs Mirena IUD, to discuss again next visit.  RTC in 4 weeks.

## 2017-07-29 NOTE — Progress Notes (Signed)
Pt is doing well.

## 2017-08-09 ENCOUNTER — Observation Stay
Admission: EM | Admit: 2017-08-09 | Discharge: 2017-08-09 | Disposition: A | Discharge status: Home / Self Care | Payer: Medicaid Other | Attending: Obstetrics and Gynecology | Admitting: Obstetrics and Gynecology

## 2017-08-09 ENCOUNTER — Ambulatory Visit
Admission: EM | Admit: 2017-08-09 | Discharge: 2017-08-09 | Disposition: A | Discharge status: Home / Self Care | Payer: Medicaid Other | Attending: Family Medicine | Admitting: Family Medicine

## 2017-08-09 ENCOUNTER — Other Ambulatory Visit: Payer: Self-pay

## 2017-08-09 DIAGNOSIS — Z3A27 27 weeks gestation of pregnancy: Secondary | ICD-10-CM | POA: Diagnosis not present

## 2017-08-09 DIAGNOSIS — Z331 Pregnant state, incidental: Secondary | ICD-10-CM

## 2017-08-09 DIAGNOSIS — Z79899 Other long term (current) drug therapy: Secondary | ICD-10-CM | POA: Insufficient documentation

## 2017-08-09 DIAGNOSIS — R600 Localized edema: Secondary | ICD-10-CM | POA: Diagnosis not present

## 2017-08-09 DIAGNOSIS — O1492 Unspecified pre-eclampsia, second trimester: Principal | ICD-10-CM | POA: Insufficient documentation

## 2017-08-09 DIAGNOSIS — R030 Elevated blood-pressure reading, without diagnosis of hypertension: Secondary | ICD-10-CM | POA: Diagnosis not present

## 2017-08-09 DIAGNOSIS — O162 Unspecified maternal hypertension, second trimester: Secondary | ICD-10-CM | POA: Diagnosis present

## 2017-08-09 LAB — PROTEIN / CREATININE RATIO, URINE
Creatinine, Urine: 194 mg/dL
Protein Creatinine Ratio: 0.26 mg/mg{Cre} — ABNORMAL HIGH (ref 0.00–0.15)
Total Protein, Urine: 51 mg/dL

## 2017-08-09 MED ORDER — ACETAMINOPHEN 325 MG PO TABS
ORAL_TABLET | ORAL | Status: AC
  Administered 2017-08-09: 650 mg via ORAL
  Filled 2017-08-09: qty 2

## 2017-08-09 MED ORDER — LABETALOL HCL 100 MG PO TABS
ORAL_TABLET | ORAL | Status: AC
  Administered 2017-08-09: 200 mg via ORAL
  Filled 2017-08-09: qty 2

## 2017-08-09 MED ORDER — LABETALOL HCL 100 MG PO TABS
200.0000 mg | ORAL_TABLET | Freq: Once | ORAL | Status: AC
  Administered 2017-08-09: 200 mg via ORAL

## 2017-08-09 MED ORDER — LABETALOL HCL 200 MG PO TABS: 200.0000 mg | ORAL_TABLET | Freq: Two times a day (BID) | ORAL | 1 refills | Status: DC

## 2017-08-09 MED ORDER — ACETAMINOPHEN 325 MG PO TABS
650.0000 mg | ORAL_TABLET | Freq: Once | ORAL | Status: AC
  Administered 2017-08-09: 650 mg via ORAL

## 2017-08-09 NOTE — ED Provider Notes (Signed)
[facility] ____________________________________________  Time seen: Approximately 2:58 PM  I have reviewed the triage vital signs and the nursing notes.   HISTORY  Chief Complaint Foot Swelling   HPI Morgan Ali is a 37 y.o. female 5227 weeks pregnant presenting for evaluation of bilateral lower leg swelling and intermittent bilateral hand swelling that is been present for the last 3 days.  Patient reports otherwise feels well.  States she continues to feel fetus move well.  Denies any vaginal bleeding, vaginal leaking, discharge dysuria, abdominal pain or back pain.  No accompanying fevers.  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  However states any time in the past has had leg swelling it resolved quickly after rest which this has not.  Denies hypertension, though blood pressure elevated at urgent care.  Patient reports gravida 2, para 1 with uneventful previous pregnancy per patient.  Patient reports otherwise feels well.  Denies alleviating measures attempted.  States bilateral legs feel achy due to the fluid but not painful.  Denies chest pain, shortness of breath, abdominal pain, dysuria, or rash. Denies recent sickness. Denies recent antibiotic use.   Follows with [facility]   Past Medical History:  Diagnosis Date  . Anxiety   . Arthritis   . GERD (gastroesophageal reflux disease)   . Gestational diabetes    borderline with daughter  . Pregnancy induced hypertension    flucuates  . Seizures (HCC)    only one due to a reaction to tramadol  . Vaginal Pap smear, abnormal    had colposcopy    Patient Active Problem List   Diagnosis Date Noted  . Indication for care in labor or delivery 08/09/2017  . Vitamin D deficiency 10/28/2016  . Chronic ankle pain (Location of Primary Source of Pain) (Left) 10/22/2016  . Long term (current) use of opiate analgesic 10/22/2016  . Long term prescription opiate  use 10/22/2016  . Opiate use 10/22/2016  . Traumatic arthritis of ankle (Left) 10/22/2016  . Post-traumatic osteoarthritis of ankle (Left) 10/22/2016  . Low serum potassium level 10/22/2016  . Chronic pain syndrome 10/21/2016  . Cervical spondylosis with radiculopathy 10/21/2016  . Long term prescription benzodiazepine use 10/21/2016  . Cervical radiculopathy, chronic 10/21/2016  . Anxiety 10/16/2016  . GERD (gastroesophageal reflux disease) 10/16/2016  . Seizures (HCC) 10/16/2016  . Herniation of intervertebral disc at C5-C6 level 12/01/2015  . History of fracture of left ankle 12/01/2015  . Chronic shoulder pain (Location of Secondary source of pain) (Right) 12/01/2015  . Chronic neck pain 10/09/2015    Past Surgical History:  Procedure Laterality Date  . ANKLE FRACTURE SURGERY    . DILATION AND EVACUATION N/A 09/04/2016   Procedure: DILATATION AND EVACUATION;  Surgeon: Linzie Collinavid James Evans, MD;  Location: [facility] ORS;  Service: Gynecology;  Laterality: N/A;     No current facility-administered medications for this encounter.  No current outpatient medications on file.  Allergies Tramadol  Family History  Problem Relation Age of Onset  . Arthritis Mother   . Asthma Mother   . Hyperlipidemia Mother   . Hypertension Mother   . Stroke Mother   . Vision loss Mother   . Alcohol abuse Father   . Kidney disease Father   . Depression Maternal Aunt   . Arthritis Maternal Grandmother   . COPD Maternal Grandmother   . Hyperlipidemia Maternal Grandmother   . Hypertension Maternal Grandmother   . Varicose  Veins Maternal Grandmother   . Diabetes Maternal Grandfather   . Hyperlipidemia Maternal Grandfather   . Hypertension Maternal Grandfather   . Hearing loss Paternal Grandmother   . Varicose Veins Paternal Grandmother   . Heart disease Paternal Grandfather     Social History Social History   Tobacco Use  . Smoking status: Former Games developer  . Smokeless tobacco: Never Used    Substance Use Topics  . Alcohol use: No  . Drug use: No    Review of Systems Constitutional: No fever/chills ENT: No sore throat. Cardiovascular: Denies chest pain. Respiratory: Denies shortness of breath. Gastrointestinal: No abdominal pain.  No nausea, no vomiting.  No diarrhea.  Genitourinary: Negative for dysuria. Skin: Negative for rash.  ____________________________________________   PHYSICAL EXAM:  VITAL SIGNS: ED Triage Vitals  Enc Vitals Group     BP 08/09/17 1413 (!) 161/97     Pulse Rate 08/09/17 1413 66     Resp 08/09/17 1413 20     Temp 08/09/17 1413 97.9 F (36.6 C)     Temp Source 08/09/17 1413 Oral     SpO2 08/09/17 1413 100 %     Weight 08/09/17 1419 257 lb (116.6 kg)     Height 08/09/17 1419 5\' 3"  (1.6 m)     Head Circumference --      Peak Flow --      Pain Score --      Pain Loc --      Pain Edu? --      Excl. in GC? --     Constitutional: Alert and oriented. Well appearing and in no acute distress. ENT      Head: Normocephalic and atraumatic.      Mouth/Throat: Mucous membranes are moist.Oropharynx non-erythematous. Cardiovascular: Normal rate, regular rhythm. Grossly normal heart sounds.  Good peripheral circulation. Respiratory: Normal respiratory effort without tachypnea nor retractions. Breath sounds are clear and equal bilaterally. No wheezes, rales, rhonchi. Gastrointestinal: Soft and nontender. Gravid abdomen. No CVA tenderness. Musculoskeletal: Bilateral lower extremities +1 pitting edema pretibial distally.  Bilateral lower extremities nontender to palpation.  Bilateral pedal pulses equal and easily palpated. Neurologic:  Normal speech and language. No gross focal neurologic deficits are appreciated. Speech is normal. No gait instability.  Skin:  Skin is warm, dry and intact. No rash noted. Psychiatric: Mood and affect are normal. Speech and behavior are normal. Patient exhibits appropriate insight and judgment    ___________________________________________   LABS (all labs ordered are listed, but only abnormal results are displayed)  Labs Reviewed - No data to display  PROCEDURES Procedures    INITIAL IMPRESSION / ASSESSMENT AND PLAN / ED COURSE  Pertinent labs & imaging results that were available during my care of the patient were reviewed by me and considered in my medical decision making (see chart for details).  Well-appearing patient.  No acute distress.  [redacted] weeks pregnant with elevated blood pressure reading urgent care as well as bilateral lower extremity +1 pitting edema and subjective edema in bilateral hands.  Discussed in detail with patient concern for preeclampsia.  Recommend for further evaluation and monitoring at hospital labor and delivery unit at this time.  Patient agrees with plan.  Jae Dire RN triage nurse at [facility] called and given report.  Patient states she will go directly to the ER.  Patient stable at time of discharge.  ____________________________________________   FINAL CLINICAL IMPRESSION(S) / ED DIAGNOSES  Final diagnoses:  Elevated blood pressure reading  Extremity edema  [redacted]  weeks gestation of pregnancy     ED Discharge Orders    None       Note: This dictation was prepared with Dragon dictation along with smaller phrase technology. Any transcriptional errors that result from this process are unintentional.         Renford Dills, NP 08/09/17 [phone]

## 2017-08-09 NOTE — Discharge Summary (Signed)
    L&D OB Triage Note  SUBJECTIVE Morgan Ali is a 37 y.o. [id number] female at 5414w1d, EDD Estimated Date of Delivery: 11/07/17 who presented to triage with complaints of hypertension.  Patient was seen in the office and found to have some lower leg edema and hypertension. She denies other secondary symptoms including headache epigastric pain etc.  Obstetric History   G5   P1   T1   P0   A2   L1    SAB1   TAB1   Ectopic0   Multiple0   Live Births1     # Outcome Date GA Lbr Len/2nd Weight Sex Delivery Anes PTL Lv  5 Current           4 Term 10/29/05 8112w0d  7 lb 12.8 oz (3.538 kg) F Vag-Spont  N LIV  3 TAB 09/2004        ND  2 Gravida           1 SAB               Medications Prior to Admission  Medication Sig Dispense Refill Last Dose  . Biotin 1 MG CAPS Take 1 tablet by mouth daily.   Past Week at Unknown time  . fluticasone (FLONASE) 50 MCG/ACT nasal spray Place 2 sprays into both nostrils daily. 16 g 0 08/09/2017 at Unknown time  . folic acid (FOLVITE) 1 MG tablet Take 1 mg by mouth daily.   08/09/2017 at Unknown time  . Prenat-Fe Poly-Methfol-FA-DHA (VITAFOL ULTRA) 29-0.6-0.4-200 MG CAPS Take 1 tablet by mouth daily. 30 capsule 6 08/09/2017 at Unknown time     OBJECTIVE  Nursing Evaluation:   BP 130/82   Pulse 65   Temp 98.5 F (36.9 C) (Oral)   LMP 01/31/2017    Findings:  Patient is moderately hypertensive.     Normal P/C ratio  NST was performed and has been reviewed by me.  NST INTERPRETATION: Category I  appropriate for gestational age  Mode: External Baseline Rate (A): 145 bpm Variability: Moderate Accelerations: 10 x 10 Decelerations: Variable     Contraction Frequency (min): none noted  ASSESSMENT Impression:  1.  Pregnancy:  [id number] at 4414w1d , EDD Estimated Date of Delivery: 11/07/17 2.  NST: Appropriate for gestational age  PLAN 1. Reassurance given 2. Discharge home with standard labor precautions given to return to L&D or call the office for  problems. 3. Continue routine prenatal care. 4.  Will begin labetalol 200 twice daily for control of gestational hypertension. 5.  Patient to follow-up later this week.

## 2017-08-09 NOTE — OB Triage Note (Signed)
Patient was seen at urgent care today for swelling up to about mid calf, no other complaints.

## 2017-08-09 NOTE — Discharge Instructions (Signed)
Please call or return to the Birthplace with any of the following: Vaginal Bleeding Decreased Fetal Movement Contraction every 3-5 mins for more than an hour Leaking of Fluid

## 2017-08-09 NOTE — Discharge Instructions (Signed)
Go directly to the emergency room as discussed.  °

## 2017-08-09 NOTE — ED Triage Notes (Addendum)
Pt is [redacted] weeks pregnant. Noticed bilateral ankle/feet swelling x yesterday. Feet become painful when walking. FHTs heard by doppler in triage at 143

## 2017-08-11 ENCOUNTER — Telehealth: Payer: Self-pay | Admitting: Obstetrics and Gynecology

## 2017-08-11 NOTE — Telephone Encounter (Signed)
The patient called and stated that she would like to have a nurse call her as soon as possible her feet are swollen and they have not went down yet, No other information was disclosed. Please advise.

## 2017-08-11 NOTE — Telephone Encounter (Signed)
Pt seen in L&D on 08/09/2017. Started on bp meds. Per [employer] needs f/u in one week. Appt made for 08/14/2017 at 11:15.

## 2017-08-14 ENCOUNTER — Ambulatory Visit (INDEPENDENT_AMBULATORY_CARE_PROVIDER_SITE_OTHER): Payer: Medicaid Other | Admitting: Obstetrics and Gynecology

## 2017-08-14 ENCOUNTER — Inpatient Hospital Stay
Admission: EM | Admit: 2017-08-14 | Discharge: 2017-08-14 | Disposition: A | Discharge status: Home / Self Care | Payer: Medicaid Other | Attending: Obstetrics and Gynecology | Admitting: Obstetrics and Gynecology

## 2017-08-14 ENCOUNTER — Encounter: Payer: Medicaid Other | Admitting: Obstetrics and Gynecology

## 2017-08-14 ENCOUNTER — Encounter: Payer: Self-pay | Admitting: Obstetrics and Gynecology

## 2017-08-14 VITALS — BP 165/94 | HR 74 | Wt 261.0 lb

## 2017-08-14 DIAGNOSIS — Z3482 Encounter for supervision of other normal pregnancy, second trimester: Secondary | ICD-10-CM

## 2017-08-14 DIAGNOSIS — Z3A27 27 weeks gestation of pregnancy: Secondary | ICD-10-CM | POA: Diagnosis not present

## 2017-08-14 DIAGNOSIS — O132 Gestational [pregnancy-induced] hypertension without significant proteinuria, second trimester: Secondary | ICD-10-CM

## 2017-08-14 DIAGNOSIS — O09522 Supervision of elderly multigravida, second trimester: Secondary | ICD-10-CM | POA: Diagnosis not present

## 2017-08-14 LAB — POCT URINALYSIS DIPSTICK
Bilirubin, UA: NEGATIVE
Blood, UA: NEGATIVE
Glucose, UA: NEGATIVE
Ketones, UA: NEGATIVE
LEUKOCYTES UA: NEGATIVE
Nitrite, UA: NEGATIVE
PH UA: 7.5 (ref 5.0–8.0)
Spec Grav, UA: <=1.005 — AB (ref 1.010–1.025)
UROBILINOGEN UA: 0.2 U/dL

## 2017-08-14 MED ORDER — BETAMETHASONE SOD PHOS & ACET 6 (3-3) MG/ML IJ SUSP
INTRAMUSCULAR | Status: AC
  Filled 2017-08-14: qty 1

## 2017-08-14 MED ORDER — BETAMETHASONE SOD PHOS & ACET 6 (3-3) MG/ML IJ SUSP
12.0000 mg | INTRAMUSCULAR | Status: DC
  Administered 2017-08-14: 12 mg via INTRAMUSCULAR

## 2017-08-14 NOTE — Progress Notes (Addendum)
ROB:  Re-check = 162/82.  GBS done.  Pt to L&D for betamethasone to be repeated in 24 hours.  Then follow-up in L&D on Saturday for NST PC ratio and blood pressure check.  Patient will increase labetalol to 200mg  TID.  Signs and symptoms of preeclampsia discussed in detail patient to present to labor delivery or call if she worsens. Pt had sore throat for 2 days - rapid strep = NEGATIVE

## 2017-08-14 NOTE — Addendum Note (Signed)
Addended by: Silvano BilisHAMPTON, Iysha Mishkin L on: 08/14/2017 03:47 PM   Modules accepted: Orders

## 2017-08-14 NOTE — Progress Notes (Signed)
Pt bp was 165/94. Pt feet have been swelling really bad since Feb 27,19. Having some congestion issues x 3days

## 2017-08-15 ENCOUNTER — Inpatient Hospital Stay
Admission: EM | Admit: 2017-08-15 | Discharge: 2017-08-15 | Disposition: A | Discharge status: Home / Self Care | Payer: Medicaid Other | Attending: Obstetrics and Gynecology | Admitting: Obstetrics and Gynecology

## 2017-08-15 ENCOUNTER — Telehealth: Payer: Self-pay | Admitting: Obstetrics and Gynecology

## 2017-08-15 DIAGNOSIS — Z3A29 29 weeks gestation of pregnancy: Secondary | ICD-10-CM | POA: Diagnosis not present

## 2017-08-15 DIAGNOSIS — O1413 Severe pre-eclampsia, third trimester: Secondary | ICD-10-CM | POA: Diagnosis present

## 2017-08-15 MED ORDER — BETAMETHASONE SOD PHOS & ACET 6 (3-3) MG/ML IJ SUSP
12.0000 mg | Freq: Once | INTRAMUSCULAR | Status: AC
  Administered 2017-08-15: 12 mg via INTRAMUSCULAR

## 2017-08-15 NOTE — Telephone Encounter (Signed)
Pt states [employer] did not want her to work on Saturday after nst. Work note given. Pt will access thru my chart.

## 2017-08-15 NOTE — Telephone Encounter (Signed)
The patient called and stated that she is having an NST at the hospital and that Dr. Logan BoresEvans stated that she should not go to work Saturday the same day as her NST. The patient's employer needs a note for the employee to be out of work. No other information was disclosed. Pl,ease advise. The patient would like a call back as soon as possible. Please advise.

## 2017-08-16 ENCOUNTER — Other Ambulatory Visit: Payer: Self-pay

## 2017-08-16 ENCOUNTER — Observation Stay
Admission: EM | Admit: 2017-08-16 | Discharge: 2017-08-16 | Disposition: A | Discharge status: Home / Self Care | Payer: Medicaid Other | Attending: Obstetrics and Gynecology | Admitting: Obstetrics and Gynecology

## 2017-08-16 DIAGNOSIS — Z3A28 28 weeks gestation of pregnancy: Secondary | ICD-10-CM

## 2017-08-16 DIAGNOSIS — O26893 Other specified pregnancy related conditions, third trimester: Secondary | ICD-10-CM | POA: Diagnosis not present

## 2017-08-16 DIAGNOSIS — Z3490 Encounter for supervision of normal pregnancy, unspecified, unspecified trimester: Secondary | ICD-10-CM

## 2017-08-16 DIAGNOSIS — O163 Unspecified maternal hypertension, third trimester: Secondary | ICD-10-CM | POA: Diagnosis present

## 2017-08-16 LAB — PROTEIN / CREATININE RATIO, URINE
CREATININE, URINE: 99 mg/dL
Protein Creatinine Ratio: 0.42 mg/mg{Cre} — ABNORMAL HIGH (ref 0.00–0.15)
TOTAL PROTEIN, URINE: 42 mg/dL

## 2017-08-16 LAB — STREP GP B NAA: STREP GROUP B AG: POSITIVE — AB

## 2017-08-16 NOTE — OB Triage Note (Signed)
Pt presents for NST at 28.1 weeks and labs.  Pt denies HA, N/V,  visual changes, or epigastric pain.  Reports improvement in BLE swelling.  Upon assessment 1+ pitting edema around ankles.  No atypical swelling present.  Lung sounds clear bilaterally.  Pt reports good fetal mov't and denies ctx.

## 2017-08-16 NOTE — OB Triage Note (Signed)
Pt instructed to follow up by calling MD office on Monday morning to schedule appt for Tuesday for BP re-evaluation.  Pt educated on signs/symptoms to report regarding pre-eclampsia.  Pt verbalizes understanding.

## 2017-08-17 NOTE — Discharge Summary (Signed)
L&D OB Triage Note  Morgan Ali is a 37 y.o. [id number] female at 8580w1d, EDD Estimated Date of Delivery: 11/07/17 who presented to triage for scheduled NST for elevated BP's rule out pre-eclampsia.  She completed second dose of antenatal steroids yesterday. Vital signs were reviewed, patient with mild to moderate elevations of BP.  Just initiated an anti-hypertensive medication yesterday. An NST was performed and has been reviewed by MD.     . Vitals:   08/16/17 1137 08/16/17 1152 08/16/17 1207 08/16/17 1222  BP: (!) 143/97 (!) 147/93 (!) 151/93 (!) 144/94  Pulse: 88 86 79 84  Resp:      Temp:      TempSrc:      SpO2:      Weight:      Height:        NST INTERPRETATION: Indications: pregnancy-induced hypertension  Mode: External Baseline Rate (A): 135 bpm Variability: Minimal, Moderate Accelerations: None Decelerations: None     Contraction Frequency (min): 0  Impression: reactive    Labs:  Results for orders placed or performed during the hospital encounter of 08/16/17  Protein / creatinine ratio, urine  Result Value Ref Range   Creatinine, Urine 99 mg/dL   Total Protein, Urine 42 mg/dL   Protein Creatinine Ratio 0.42 (H) 0.00 - 0.15 mg/mg[Cre]    Plan: NST performed was reviewed and was found to be reactive. Her urine P/C ratio has increased slightly from 5 days ago (was 0.26).  Patient now with newly diagnosed mild pre-eclampsia. Due to her gestational age, we will continue to monitor the pregnancy closely with twice weekly labs and at least weekly office visits.  Will need to begin antenatal testing.  Can continue until pregnancy progresses to severe pre-eclampsia or until [redacted] weeks gestation, whichever is sooner. She was discharged home with PIH precautions.  Continue routine prenatal care. Continue current anti-hypertensive. Follow up with OB/GYN as previously scheduled.     Hildred LaserAnika Koda Routon, MD [facility]

## 2017-08-19 ENCOUNTER — Observation Stay
Admission: EM | Admit: 2017-08-19 | Discharge: 2017-08-19 | Disposition: A | Discharge status: Other Acute Inpatient Hospital | Payer: Medicaid Other | Attending: Obstetrics and Gynecology | Admitting: Obstetrics and Gynecology

## 2017-08-19 ENCOUNTER — Encounter: Payer: Self-pay | Admitting: Obstetrics and Gynecology

## 2017-08-19 ENCOUNTER — Ambulatory Visit (INDEPENDENT_AMBULATORY_CARE_PROVIDER_SITE_OTHER): Payer: Medicaid Other | Admitting: Obstetrics and Gynecology

## 2017-08-19 ENCOUNTER — Other Ambulatory Visit: Payer: Self-pay

## 2017-08-19 VITALS — BP 169/109 | HR 88 | Wt 267.0 lb

## 2017-08-19 DIAGNOSIS — G894 Chronic pain syndrome: Secondary | ICD-10-CM | POA: Diagnosis not present

## 2017-08-19 DIAGNOSIS — O1490 Unspecified pre-eclampsia, unspecified trimester: Secondary | ICD-10-CM | POA: Diagnosis present

## 2017-08-19 DIAGNOSIS — O99353 Diseases of the nervous system complicating pregnancy, third trimester: Secondary | ICD-10-CM | POA: Insufficient documentation

## 2017-08-19 DIAGNOSIS — O99283 Endocrine, nutritional and metabolic diseases complicating pregnancy, third trimester: Secondary | ICD-10-CM | POA: Insufficient documentation

## 2017-08-19 DIAGNOSIS — E876 Hypokalemia: Secondary | ICD-10-CM | POA: Insufficient documentation

## 2017-08-19 DIAGNOSIS — Z3483 Encounter for supervision of other normal pregnancy, third trimester: Secondary | ICD-10-CM

## 2017-08-19 DIAGNOSIS — O133 Gestational [pregnancy-induced] hypertension without significant proteinuria, third trimester: Secondary | ICD-10-CM

## 2017-08-19 DIAGNOSIS — Z79899 Other long term (current) drug therapy: Secondary | ICD-10-CM | POA: Insufficient documentation

## 2017-08-19 DIAGNOSIS — O1493 Unspecified pre-eclampsia, third trimester: Principal | ICD-10-CM | POA: Insufficient documentation

## 2017-08-19 DIAGNOSIS — Z87891 Personal history of nicotine dependence: Secondary | ICD-10-CM | POA: Insufficient documentation

## 2017-08-19 DIAGNOSIS — Z3A28 28 weeks gestation of pregnancy: Secondary | ICD-10-CM | POA: Diagnosis not present

## 2017-08-19 LAB — POCT URINALYSIS DIPSTICK
Bilirubin, UA: NEGATIVE
Blood, UA: NEGATIVE
Glucose, UA: NEGATIVE
KETONES UA: NEGATIVE
LEUKOCYTES UA: NEGATIVE
NITRITE UA: NEGATIVE
Odor: NEGATIVE
PH UA: 7.5 (ref 5.0–8.0)
SPEC GRAV UA: 1.01 (ref 1.010–1.025)
UROBILINOGEN UA: 0.2 U/dL

## 2017-08-19 LAB — URINALYSIS, COMPLETE (UACMP) WITH MICROSCOPIC
Bilirubin Urine: NEGATIVE
GLUCOSE, UA: NEGATIVE mg/dL
HGB URINE DIPSTICK: NEGATIVE
Ketones, ur: NEGATIVE mg/dL
Leukocytes, UA: NEGATIVE
Nitrite: NEGATIVE
PH: 7 (ref 5.0–8.0)
Protein, ur: NEGATIVE mg/dL
SPECIFIC GRAVITY, URINE: 1.006 (ref 1.005–1.030)

## 2017-08-19 LAB — PROTEIN / CREATININE RATIO, URINE
Creatinine, Urine: 36 mg/dL
Protein Creatinine Ratio: 0.47 mg/mg{Cre} — ABNORMAL HIGH (ref 0.00–0.15)
Total Protein, Urine: 17 mg/dL

## 2017-08-19 LAB — CBC
HEMATOCRIT: 31.2 % — AB (ref 35.0–47.0)
HEMOGLOBIN: 10.6 g/dL — AB (ref 12.0–16.0)
MCH: 30.5 pg (ref 26.0–34.0)
MCHC: 33.9 g/dL (ref 32.0–36.0)
MCV: 89.8 fL (ref 80.0–100.0)
Platelets: 397 10*3/uL (ref 150–440)
RBC: 3.47 MIL/uL — AB (ref 3.80–5.20)
RDW: 14 % (ref 11.5–14.5)
WBC: 19.4 10*3/uL — AB (ref 3.6–11.0)

## 2017-08-19 LAB — BASIC METABOLIC PANEL
ANION GAP: 8 (ref 5–15)
BUN: 9 mg/dL (ref 6–20)
CHLORIDE: 109 mmol/L (ref 101–111)
CO2: 19 mmol/L — AB (ref 22–32)
Calcium: 8.3 mg/dL — ABNORMAL LOW (ref 8.9–10.3)
Creatinine, Ser: 0.54 mg/dL (ref 0.44–1.00)
GFR calc non Af Amer: >60 mL/min (ref >60)
Glucose, Bld: 80 mg/dL (ref 65–99)
POTASSIUM: 2.8 mmol/L — AB (ref 3.5–5.1)
SODIUM: 136 mmol/L (ref 135–145)

## 2017-08-19 MED ORDER — LACTATED RINGERS IV SOLN
INTRAVENOUS | Status: DC
Start: 2017-08-19 — End: 2017-08-19
  Administered 2017-08-19: 13:00:00 via INTRAVENOUS

## 2017-08-19 MED ORDER — LABETALOL HCL 5 MG/ML IV SOLN
20.0000 mg | INTRAVENOUS | Status: AC | PRN
  Administered 2017-08-19: 80 mg via INTRAVENOUS
  Administered 2017-08-19: 20 mg via INTRAVENOUS
  Administered 2017-08-19: 40 mg via INTRAVENOUS
  Filled 2017-08-19: qty 16

## 2017-08-19 MED ORDER — LABETALOL HCL 5 MG/ML IV SOLN
40.0000 mg | Freq: Once | INTRAVENOUS | Status: DC | PRN
  Filled 2017-08-19: qty 8

## 2017-08-19 MED ORDER — LABETALOL HCL 5 MG/ML IV SOLN
INTRAVENOUS | Status: AC
  Administered 2017-08-19: 20 mg via INTRAVENOUS
  Filled 2017-08-19: qty 4

## 2017-08-19 MED ORDER — NIFEDIPINE 10 MG PO CAPS: 10.0000 mg | ORAL_CAPSULE | ORAL | Status: DC | PRN

## 2017-08-19 MED ORDER — HYDRALAZINE HCL 20 MG/ML IJ SOLN
10.0000 mg | Freq: Once | INTRAMUSCULAR | Status: AC
  Administered 2017-08-19: 10 mg via INTRAVENOUS

## 2017-08-19 MED ORDER — HYDRALAZINE HCL 20 MG/ML IJ SOLN
10.0000 mg | Freq: Once | INTRAMUSCULAR | Status: AC | PRN
  Administered 2017-08-19: 10 mg via INTRAVENOUS
  Filled 2017-08-19: qty 1

## 2017-08-19 NOTE — Progress Notes (Signed)
ROB: Taking labetalol 3 times daily.  Patient seen in labor and delivery on Saturday worsening UPC  -now 420. Trace protein on dip today.  Normal reflexes without clonus.  No other signs and symptoms. Patient with severe range blood pressures-discussed with [facility] perinatal.  Re[facility] with [facility] until delivery.  [facility].  Discussed in detail with patient.  Re-check = 158/110.

## 2017-08-19 NOTE — H&P (Signed)
Obstetric History and Physical  Morgan Ali is a 37 y.o. [phone] with IUP at [redacted]w[redacted]d presenting for stabilization of severe range pressures prior to transfer to [facility] care center for management of severe preeclampsia prior to [redacted] weeks gestation. Patient has received 2 doses of betamethasone for lung maturation on March 7 and March 8.  GBS culture from March 7 is positive.  Patient had been placed on labetalol 200 mg twice a day for attempted management of severe range pressures.  At prenatal visit today, severe range pressures were identified (169/106, and 158/110-manual) and she was transferred to labor delivery for stabilization prior to transport. Severe range pressures in labor and delivery were treated with labetalol 20 mg-labetalol 40 mg-labetalol 80 mg; followed by hydralazine 10 mg-hydralazine 10 mg  Dr. [place] Grotegut was consulted; Dr Irene Pap, [facility] will be the accepting perinatologist..  Prenatal risk factors: Preeclampsia; urine protein/creatinine ratio was 420 on 08/14/2017. Polysubstance use-chronic use of benzodiazepines and opiates; no recent use History of chronic pain syndrome History of seizures-only one, due to a tramadol reaction Hypokalemia  Prenatal Course Source of Care: [facility] Pregnancy complications or risks: Patient Active Problem List   Diagnosis Date Noted  . Pre-eclampsia 08/19/2017  . Pregnancy 08/16/2017  . Indication for care in labor or delivery 08/09/2017  . Vitamin D deficiency 10/28/2016  . Chronic ankle pain (Location of Primary Source of Pain) (Left) 10/22/2016  . Long term (current) use of opiate analgesic 10/22/2016  . Long term prescription opiate use 10/22/2016  . Opiate use 10/22/2016  . Traumatic arthritis of ankle (Left) 10/22/2016  . Post-traumatic osteoarthritis of ankle (Left) 10/22/2016  . Low serum potassium level 10/22/2016  . Chronic pain syndrome 10/21/2016  . Cervical spondylosis with  radiculopathy 10/21/2016  . Long term prescription benzodiazepine use 10/21/2016  . Cervical radiculopathy, chronic 10/21/2016  . Anxiety 10/16/2016  . GERD (gastroesophageal reflux disease) 10/16/2016  . Seizures (HCC) 10/16/2016  . Herniation of intervertebral disc at C5-C6 level 12/01/2015  . History of fracture of left ankle 12/01/2015  . Chronic shoulder pain (Location of Secondary source of pain) (Right) 12/01/2015  . Chronic neck pain 10/09/2015   Prenatal labs and studies: ABO, Rh: O/Positive/-- (10/30 1342) Antibody: Negative (10/30 1342) Rubella: 3.22 (10/30 1342) RPR: Non Reactive (10/30 1342)  HBsAg: Negative (10/30 1342)  HIV: Non Reactive (10/30 1342)  XBJ:YNWGNFAO (03/07 1435) 1 hr Glucola not done Genetic screening normal maternity 21-negative for trisomies Anatomy US normal  Prenatal Transfer Tool  Maternal Diabetes: No Genetic Screening: Normal maternity 21 Maternal Ultrasounds/Referrals: Normal Fetal Ultrasounds or other Referrals:  None Maternal Substance Abuse:  Yes:  Type: Prescription drugs, Other: Opiate; benzodiazepine Significant Maternal Medications:  Meds include: Other: Labetalol 200 mg twice a day Significant Maternal Lab Results: Lab values include: Other: See above  Past Medical History:  Diagnosis Date  . Anxiety   . Arthritis   . GERD (gastroesophageal reflux disease)   . Gestational diabetes    borderline with daughter  . Pregnancy induced hypertension    flucuates  . Seizures (HCC)    only one due to a reaction to tramadol  . Vaginal Pap smear, abnormal    had colposcopy    Past Surgical History:  Procedure Laterality Date  . ANKLE FRACTURE SURGERY    . DILATION AND EVACUATION N/A 09/04/2016   Procedure: DILATATION AND EVACUATION;  Surgeon: Linzie Collin, MD;  Location: [facility] ORS;  Service: Gynecology;  Laterality: N/A;  OB History  Gravida Para Term Preterm AB Living  5 1 1   2 1   SAB TAB Ectopic Multiple Live Births   1 1     1     # Outcome Date GA Lbr Len/2nd Weight Sex Delivery Anes PTL Lv  5 Current           4 Term 10/29/05 257w0d  7 lb 12.8 oz (3.538 kg) F Vag-Spont  N LIV  3 TAB 09/2004        ND  2 Gravida           1 SAB               Social History   Socioeconomic History  . Marital status: Single    Spouse name: None  . Number of children: None  . Years of education: None  . Highest education level: None  Social Needs  . Financial resource strain: None  . Food insecurity - worry: None  . Food insecurity - inability: None  . Transportation needs - medical: None  . Transportation needs - non-medical: None  Occupational History  . None  Tobacco Use  . Smoking status: Former Games developermoker  . Smokeless tobacco: Never Used  Substance and Sexual Activity  . Alcohol use: No  . Drug use: No  . Sexual activity: Yes    Birth control/protection: None  Other Topics Concern  . None  Social History Narrative  . None    Family History  Problem Relation Age of Onset  . Arthritis Mother   . Asthma Mother   . Hyperlipidemia Mother   . Hypertension Mother   . Stroke Mother   . Vision loss Mother   . Alcohol abuse Father   . Kidney disease Father   . Depression Maternal Aunt   . Arthritis Maternal Grandmother   . COPD Maternal Grandmother   . Hyperlipidemia Maternal Grandmother   . Hypertension Maternal Grandmother   . Varicose Veins Maternal Grandmother   . Diabetes Maternal Grandfather   . Hyperlipidemia Maternal Grandfather   . Hypertension Maternal Grandfather   . Hearing loss Paternal Grandmother   . Varicose Veins Paternal Grandmother   . Heart disease Paternal Grandfather     Medications Prior to Admission  Medication Sig Dispense Refill Last Dose  . Biotin 1 MG CAPS Take 1 tablet by mouth daily.   Not Taking at Unknown time  . fluticasone (FLONASE) 50 MCG/ACT nasal spray Place 2 sprays into both nostrils daily. (Patient not taking: Reported on 08/16/2017) 16 g 0 Not Taking  at Unknown time  . folic acid (FOLVITE) 1 MG tablet Take 1 mg by mouth daily.   Past Week at Unknown time  . labetalol (NORMODYNE) 200 MG tablet Take 1 tablet (200 mg total) by mouth 2 (two) times daily. (Patient taking differently: Take 200 mg by mouth 3 (three) times daily. ) 100 tablet 1 Taking  . Prenat-Fe Poly-Methfol-FA-DHA (VITAFOL ULTRA) 29-0.6-0.4-200 MG CAPS Take 1 tablet by mouth daily. 30 capsule 6 08/16/2017 at 0730    Allergies  Allergen Reactions  . Tramadol Other (See Comments)    seizure    Review of Systems: Patient denies headaches, blurred vision, scotomata, right upper quadrant pain, epigastric discomfort, other visual changes, nausea, vomiting.  She reports significant swelling over the past week.  Physical Exam: BP (!) 151/94   Pulse 79   LMP 01/31/2017  CONSTITUTIONAL: Well-developed, well-nourished female in no acute distress.  HENT:  Normocephalic, atraumatic, External  right and left ear normal. Oropharynx is clear and moist EYES: Conjunctivae and EOM are normal. No scleral icterus.  NECK: Normal range of motion, supple, no masses SKIN: Skin is warm and dry. No rash noted. Not diaphoretic. No erythema. No pallor. NEUROLGIC: Alert and oriented to person, place, and time. Normal reflexes-3/4 without clonus; normal muscle tone coordination. No cranial nerve deficit noted. PSYCHIATRIC: Normal mood and affect. Normal behavior. Normal judgment and thought content. CARDIOVASCULAR: Normal heart rate noted, regular rhythm RESPIRATORY: Effort and breath sounds normal, no problems with respiration noted ABDOMEN: Soft, nontender, nondistended, gravid. MUSCULOSKELETAL: Normal range of motion.  2+ pretibial edema and no tenderness. 2+ distal pulses.  Cervical Exam: Not done Presentation: Uncertain FHT:  Baseline rate 130 bpm   Variability moderate   Contractions: None   Pertinent Labs/Studies:   Results for orders placed or performed during the hospital encounter of  08/19/17 (from the past 24 hour(s))  Basic metabolic panel     Status: Abnormal   Collection Time: 08/19/17 12:44 PM  Result Value Ref Range   Sodium 136 135 - 145 mmol/L   Potassium 2.8 (L) 3.5 - 5.1 mmol/L   Chloride 109 101 - 111 mmol/L   CO2 19 (L) 22 - 32 mmol/L   Glucose, Bld 80 65 - 99 mg/dL   BUN 9 6 - 20 mg/dL   Creatinine, Ser 1.61 0.44 - 1.00 mg/dL   Calcium 8.3 (L) 8.9 - 10.3 mg/dL   GFR calc non Af Amer >60 >60 mL/min   GFR calc Af Amer >60 >60 mL/min   Anion gap 8 5 - 15  CBC     Status: Abnormal   Collection Time: 08/19/17 12:44 PM  Result Value Ref Range   WBC 19.4 (H) 3.6 - 11.0 K/uL   RBC 3.47 (L) 3.80 - 5.20 MIL/uL   Hemoglobin 10.6 (L) 12.0 - 16.0 g/dL   HCT 09.6 (L) 04.5 - 40.9 %   MCV 89.8 80.0 - 100.0 fL   MCH 30.5 26.0 - 34.0 pg   MCHC 33.9 32.0 - 36.0 g/dL   RDW 81.1 91.4 - 78.2 %   Platelets 397 150 - 440 K/uL    Assessment : Morgan Ali is a 37 y.o. [id number] at [redacted]w[redacted]d being admitted for stabilization of severe range pressures prior to transfer to [facility] perinatology. Preeclampsia Chronic pain syndrome History of chronic opiate and benzodiazepine use; none recently Hypokalemia Positive GBS culture Status post betamethasone x2 for fetal lung maturation  Plan: Admit for blood pressure stabilization Transfer to [facility] perinatology-Dr. Emerson Monte, MFM [facility] accepting physician CBC, CMP, urine protein creatinine ratio  Dr. Logan Bores [facility] did talk with Dr. [place] Gtotegut regarding plan of management  Daphine Deutscher A. Beatris Si, MD, FACOG [facility]

## 2017-08-19 NOTE — Progress Notes (Signed)
Report called to [place], [facility] fundraiserN [facility]

## 2017-08-19 NOTE — Progress Notes (Signed)
[facility] transport [facility].  Report given and care transferred to transport.

## 2017-08-19 NOTE — Progress Notes (Signed)
Pharmacy notified of need for labatelol.  Pyxis empty.

## 2017-08-19 NOTE — Progress Notes (Signed)
F/u from [facility]

## 2017-08-21 ENCOUNTER — Encounter: Payer: Medicaid Other | Admitting: Obstetrics and Gynecology

## 2017-08-25 MED ORDER — LIDOCAINE HCL 1 % IJ SOLN
.50 | INTRAMUSCULAR | Status: DC
Start: ? — End: 2017-08-25

## 2017-08-25 MED ORDER — ACETAMINOPHEN 325 MG PO TABS
650.00 | ORAL_TABLET | ORAL | Status: DC
Start: ? — End: 2017-08-25

## 2017-08-25 MED ORDER — NIFEDIPINE ER OSMOTIC RELEASE 30 MG PO TB24
30.00 | ORAL_TABLET | ORAL | Status: DC
Start: 2017-08-26 — End: 2017-08-25

## 2017-08-25 MED ORDER — IRON PO
1.00 | ORAL | Status: DC
Start: 2017-08-26 — End: 2017-08-25

## 2017-08-25 MED ORDER — GUAIFENESIN ER 600 MG PO TB12
600.00 | ORAL_TABLET | ORAL | Status: DC
Start: 2017-08-25 — End: 2017-08-25

## 2017-08-25 MED ORDER — LABETALOL HCL 200 MG PO TABS
800.00 | ORAL_TABLET | ORAL | Status: DC
Start: 2017-08-26 — End: 2017-08-25

## 2017-08-26 ENCOUNTER — Encounter: Payer: Medicaid Other | Admitting: Obstetrics and Gynecology

## 2017-08-26 ENCOUNTER — Other Ambulatory Visit: Payer: Medicaid Other

## 2017-09-11 ENCOUNTER — Encounter: Payer: Self-pay | Admitting: Obstetrics and Gynecology

## 2017-11-21 ENCOUNTER — Other Ambulatory Visit: Payer: Self-pay

## 2017-11-21 ENCOUNTER — Ambulatory Visit
Admission: EM | Admit: 2017-11-21 | Discharge: 2017-11-21 | Disposition: A | Discharge status: Home / Self Care | Payer: Medicaid Other | Attending: Registered Nurse | Admitting: Registered Nurse

## 2017-11-21 ENCOUNTER — Encounter: Payer: Self-pay | Admitting: Emergency Medicine

## 2017-11-21 DIAGNOSIS — L98491 Non-pressure chronic ulcer of skin of other sites limited to breakdown of skin: Secondary | ICD-10-CM

## 2017-11-21 DIAGNOSIS — L089 Local infection of the skin and subcutaneous tissue, unspecified: Secondary | ICD-10-CM

## 2017-11-21 DIAGNOSIS — T148XXA Other injury of unspecified body region, initial encounter: Secondary | ICD-10-CM

## 2017-11-21 MED ORDER — OXYCODONE HCL 5 MG PO TABS: 5.0000 mg | ORAL_TABLET | Freq: Two times a day (BID) | ORAL | 0 refills | Status: AC | PRN

## 2017-11-21 MED ORDER — SULFAMETHOXAZOLE-TRIMETHOPRIM 800-160 MG PO TABS: 1.0000 | ORAL_TABLET | Freq: Two times a day (BID) | ORAL | 0 refills | Status: AC

## 2017-11-21 MED ORDER — OXYCODONE HCL 5 MG PO TABA: 1.0000 | ORAL_TABLET | Freq: Two times a day (BID) | ORAL | 0 refills | Status: DC | PRN

## 2017-11-21 MED ORDER — IBUPROFEN 800 MG PO TABS: 800.0000 mg | ORAL_TABLET | Freq: Three times a day (TID) | ORAL | 0 refills | Status: AC | PRN

## 2017-11-21 MED ORDER — ACETAMINOPHEN 500 MG PO TABS: 1000.0000 mg | ORAL_TABLET | Freq: Four times a day (QID) | ORAL | 0 refills | Status: DC | PRN

## 2017-11-21 MED ORDER — MUPIROCIN 2 % EX OINT: TOPICAL_OINTMENT | CUTANEOUS | 0 refills | Status: AC

## 2017-11-21 NOTE — Discharge Instructions (Signed)
Mupirocin ointment twice a day to ulcer/incision Bactrim DS by mouth twice a day for 7-14 days continue taking if purulent discharge after 7 days Do not soak in tub Shower and allow soapy water to run over abdomen do not scrub incision Splint abdomen when driving with pillow of towel/blanket folded Change dressing twice a day and when soiled Follow up sooner if fever, nausea, vomiting, diarrhea, worsening abdomen pain/swelling

## 2017-11-21 NOTE — ED Provider Notes (Addendum)
[facility]    CSN: [id number] Arrival date & time: 11/21/17  4098     History   Chief Complaint Chief Complaint  Patient presents with  . Wound Check    HPI Morgan Ali is a 37 y.o. female.   36y/o caucasian female established patient here for c-section incision check. C-section 08/16/17 patient reported she had bad infection required wound vac and frequent follow up.  Breast fed for 6 weeks no longer breast feeding as supply could not keep up. Has had discharge and bad smell from incision had to cancel OB/GYN follow up appt due to infant had endocrinology appt scheduled same date/time.  Patient reported she has been changing dressing twice a day tan discharge.  Denied fever/chills/nausea/vomiting/diarrhea.  Abdomen very sore after driving/wearing seat belt if longer than 30 minutes in car especially and she has to take older son to camp in [facility] this week and concerned she will not be able to sleep from the pain.  Has been alternating tylenol 975mg  po QID and motrin 800mg  po TID and icing 3 times a day without relief.  Has not been applying any topical neosporin to wound but is washing with soap and water.  Most tender areas lateral incision.  Dental work for cracked tooth recently left upper     Past Medical History:  Diagnosis Date  . Anxiety   . Arthritis   . GERD (gastroesophageal reflux disease)   . Gestational diabetes    borderline with daughter  . Pregnancy induced hypertension    flucuates  . Seizures (HCC)    only one due to a reaction to tramadol  . Vaginal Pap smear, abnormal    had colposcopy    Patient Active Problem List   Diagnosis Date Noted  . Pre-eclampsia 08/19/2017  . Pregnancy 08/16/2017  . Indication for care in labor or delivery 08/09/2017  . Vitamin D deficiency 10/28/2016  . Chronic ankle pain (Location of Primary Source of Pain) (Left) 10/22/2016  . Long term (current) use of opiate analgesic 10/22/2016  . Long term prescription  opiate use 10/22/2016  . Opiate use 10/22/2016  . Traumatic arthritis of ankle (Left) 10/22/2016  . Post-traumatic osteoarthritis of ankle (Left) 10/22/2016  . Low serum potassium level 10/22/2016  . Chronic pain syndrome 10/21/2016  . Cervical spondylosis with radiculopathy 10/21/2016  . Long term prescription benzodiazepine use 10/21/2016  . Cervical radiculopathy, chronic 10/21/2016  . Anxiety 10/16/2016  . GERD (gastroesophageal reflux disease) 10/16/2016  . Seizures (HCC) 10/16/2016  . Herniation of intervertebral disc at C5-C6 level 12/01/2015  . History of fracture of left ankle 12/01/2015  . Chronic shoulder pain (Location of Secondary source of pain) (Right) 12/01/2015  . Chronic neck pain 10/09/2015    Past Surgical History:  Procedure Laterality Date  . ANKLE FRACTURE SURGERY    . DILATION AND EVACUATION N/A 09/04/2016   Procedure: DILATATION AND EVACUATION;  Surgeon: Linzie Collin, MD;  Location: [facility] ORS;  Service: Gynecology;  Laterality: N/A;    OB History    Gravida  5   Para  1   Term  1   Preterm      AB  2   Living  1     SAB  1   TAB  1   Ectopic      Multiple      Live Births  1            Home Medications    Prior  to Admission medications   Medication Sig Start Date End Date Taking? Authorizing Provider  fluticasone (FLONASE) 50 MCG/ACT nasal spray Place 2 sprays into both nostrils daily. 07/06/17  Yes Domenick Gong, MD  acetaminophen (TYLENOL) 500 MG tablet Take 2 tablets (1,000 mg total) by mouth every 6 (six) hours as needed. 11/21/17   Betancourt, Jarold Song, NP  ibuprofen (ADVIL,MOTRIN) 800 MG tablet Take 1 tablet (800 mg total) by mouth every 8 (eight) hours as needed for up to 7 days for fever, headache, mild pain or moderate pain. 11/21/17 11/28/17  Betancourt, Jarold Song, NP  mupirocin ointment (BACTROBAN) 2 % Apply twice a day to wound until healed 11/21/17 12/05/17  Betancourt, Jarold Song, NP  OxyCODONE HCl, Abuse Deter, (OXAYDO) 5  MG TABA Take 1 tablet by mouth 2 (two) times daily as needed for up to 3 days. 11/21/17 11/24/17  Betancourt, Jarold Song, NP  sulfamethoxazole-trimethoprim (BACTRIM DS,SEPTRA DS) 800-160 MG tablet Take 1 tablet by mouth 2 (two) times daily for 14 days. 11/21/17 12/05/17  Betancourt, Jarold Song, NP    Family History Family History  Problem Relation Age of Onset  . Arthritis Mother   . Asthma Mother   . Hyperlipidemia Mother   . Hypertension Mother   . Stroke Mother   . Vision loss Mother   . Alcohol abuse Father   . Kidney disease Father   . Depression Maternal Aunt   . Arthritis Maternal Grandmother   . COPD Maternal Grandmother   . Hyperlipidemia Maternal Grandmother   . Hypertension Maternal Grandmother   . Varicose Veins Maternal Grandmother   . Diabetes Maternal Grandfather   . Hyperlipidemia Maternal Grandfather   . Hypertension Maternal Grandfather   . Hearing loss Paternal Grandmother   . Varicose Veins Paternal Grandmother   . Heart disease Paternal Grandfather     Social History Social History   Tobacco Use  . Smoking status: Former Games developer  . Smokeless tobacco: Never Used  Substance Use Topics  . Alcohol use: No  . Drug use: No     Allergies   Tramadol   Review of Systems Review of Systems  Constitutional: Negative for activity change, appetite change, chills, diaphoresis, fatigue and fever.  HENT: Positive for dental problem. Negative for trouble swallowing and voice change.   Eyes: Negative for photophobia and visual disturbance.  Respiratory: Negative for cough, shortness of breath, wheezing and stridor.   Cardiovascular: Negative for chest pain.  Gastrointestinal: Positive for abdominal pain. Negative for abdominal distention, anal bleeding, blood in stool, constipation, diarrhea, nausea and vomiting.  Endocrine: Negative for cold intolerance and heat intolerance.  Genitourinary: Negative for difficulty urinating and dysuria.  Musculoskeletal: Negative for gait  problem, myalgias, neck pain and neck stiffness.  Skin: Positive for color change, rash and wound. Negative for pallor.  Allergic/Immunologic: Negative for food allergies.  Neurological: Negative for dizziness, tremors, seizures, syncope, facial asymmetry, speech difficulty, weakness, light-headedness, numbness and headaches.  Hematological: Negative for adenopathy. Does not bruise/bleed easily.  Psychiatric/Behavioral: Positive for sleep disturbance. Negative for agitation and confusion.     Physical Exam Triage Vital Signs ED Triage Vitals  Enc Vitals Group     BP 11/21/17 0946 125/85     Pulse Rate 11/21/17 0946 92     Resp 11/21/17 0946 18     Temp 11/21/17 0946 98 F (36.7 C)     Temp Source 11/21/17 0946 Oral     SpO2 11/21/17 0946 100 %     Weight  11/21/17 0943 242 lb (109.8 kg)     Height 11/21/17 0943 5\' 4"  (1.626 m)     Head Circumference --      Peak Flow --      Pain Score 11/21/17 0943 6     Pain Loc --      Pain Edu? --      Excl. in GC? --    No data found.  Updated Vital Signs BP 125/85 (BP Location: Left Arm)   Pulse 92   Temp 98 F (36.7 C) (Oral)   Resp 18   Ht 5\' 4"  (1.626 m)   Wt 242 lb (109.8 kg)   LMP 10/22/2017 (Approximate)   SpO2 100%   Breastfeeding? No   BMI 41.54 kg/m   Physical Exam  Constitutional: She is oriented to person, place, and time. Vital signs are normal. She appears well-developed and well-nourished. She is active and cooperative.  Non-toxic appearance. She does not have a sickly appearance. She does not appear ill. No distress.  HENT:  Head: Normocephalic and atraumatic.  Right Ear: Hearing and external ear normal.  Left Ear: Hearing and external ear normal.  Nose: Nose normal.  Mouth/Throat: Uvula is midline, oropharynx is clear and moist and mucous membranes are normal. No oropharyngeal exudate. No tonsillar exudate.  Eyes: Pupils are equal, round, and reactive to light. Conjunctivae, EOM and lids are normal. Right eye  exhibits no discharge. Left eye exhibits no discharge. No scleral icterus.  Neck: Trachea normal, normal range of motion and phonation normal. Neck supple.  Cardiovascular: Normal rate, regular rhythm, normal heart sounds and intact distal pulses.  Pulmonary/Chest: Effort normal and breath sounds normal. No stridor. No respiratory distress. She has no decreased breath sounds. She has no wheezes. She has no rhonchi. She has no rales. She exhibits no tenderness.  No cough observed in exam room; spoke full sentences without difficulty  Abdominal: Soft. She exhibits no shifting dullness, no distension, no pulsatile liver, no fluid wave, no abdominal bruit, no ascites, no pulsatile midline mass and no mass. Bowel sounds are decreased. There is tenderness in the suprapubic area. There is no rigidity, no rebound, no guarding, no CVA tenderness, no tenderness at McBurney's point and negative Murphy's sign. No hernia. Hernia confirmed negative in the ventral area.    Hypoactive bowel sounds x 4 quads; abdomen soft nondistended dull to percussion x 4 quads  Musculoskeletal: Normal range of motion. She exhibits no edema, tenderness or deformity.       Right shoulder: Normal.       Left shoulder: Normal.       Right elbow: Normal.      Left elbow: Normal.       Right hip: Normal.       Left hip: Normal.       Right knee: Normal.       Left knee: Normal.       Right ankle: Normal.       Left ankle: Normal.       Cervical back: Normal.       Thoracic back: Normal.       Lumbar back: Normal.       Right hand: Normal.       Left hand: Normal.  Lymphadenopathy:       Right: No inguinal adenopathy present.       Left: No inguinal adenopathy present.  Neurological: She is alert and oriented to person, place, and time. She has normal strength. She  is not disoriented. She displays no atrophy and no tremor. No cranial nerve deficit or sensory deficit. She exhibits normal muscle tone. She displays no seizure  activity. Coordination and gait normal. GCS eye subscore is 4. GCS verbal subscore is 5. GCS motor subscore is 6.  Bilateral hand grasp equal bilaterally 5/5; gait sure and steady in hallway; carried infant car seat carrier and diaper bag; on/off exam table without difficulty  Skin: Skin is warm and intact. Capillary refill takes less than 2 seconds. Rash noted. No abrasion, no bruising, no burn, no ecchymosis, no laceration, no lesion, no petechiae and no purpura noted. Rash is macular, papular, maculopapular and pustular. Rash is not nodular, not vesicular and not urticarial. She is not diaphoretic. There is erythema. No cyanosis. No pallor. Nails show no clubbing.     Wound edges well approximated suprapubic with yellow tan foul opaque smelling discharge in creases; removed 2x2 gauze secured with padded bandage 7 inches long 3 inches wide from incision prior to evaluation that had left sided greater than white tan discharge on gauze not saturated or dripping.  Skin with adhesive remnants after dressing removed no abrasion or erythema under adhesive noted. Ulcer lateral left incision beefy red noncommunicating less than 1 cm diameter  Psychiatric: She has a normal mood and affect. Her speech is normal and behavior is normal. Judgment and thought content normal. She is not actively hallucinating. Cognition and memory are normal. She is attentive.  Nursing note and vitals reviewed.    UC Treatments / Results  Labs (all labs ordered are listed, but only abnormal results are displayed) Labs Reviewed - No data to display  EKG None  Radiology No results found.  Procedures Procedures (including critical care time)  Medications Ordered in UC Medications - No data to display  Initial Impression / Assessment and Plan / UC Course  I have reviewed the triage vital signs and the nursing notes.  Pertinent labs & imaging results that were available during my care of the patient were reviewed by me  and considered in my medical decision making (see chart for details).   Discussed with patient she has incision infection and ulcer adjacent to c-section incision on abdomen. Discussed pain should decrease with treatment of infection.   Will require oral and topical antibiotics and frequent dressing changes.  Prefer not to secure with tape to avoid adhesive irritation/abrasion.  Apply mupirocin BID after showering with soap and water do not aggressively scrub area but allow soapy water to wash over area.  Start bactrim DS po BID for at least 7 days if still purulent discharge continue for full 14 days.  Wound care completed by nursing staff after evaluation and dressing change with telfa secured with mesh underwear instead of adhesive after triple antibiotic applied to incision/ulcer.  Exitcare handout on skin infection given to patient.  RTC if worsening erythema, pain, purulent discharge, fever. Schedule follow up with OB GYN for week after return from [place]  Do not drive after taking oxycodone.  Do not drink alcohol only use if having breakthrough pain after motrin and tylenol alternating and may apply cryotherapy to area also 15 minutes QID. Wash towels, washcloths, sheets in hot water with bleach every couple of days until infection resolved.  Discussed use blow dryer on cool setting to ensure abdomen folds completely dry prior to applying dressing to avoid skin maceration.  Do not soak in lake/river/pond/tub/pool until healed.  Patient verbalized understanding, agreed with plan of care and  had no further questions at this time.     Final Clinical Impressions(s) / UC Diagnoses   Final diagnoses:  Wound infection     Discharge Instructions     Mupirocin ointment twice a day to ulcer/incision Bactrim DS by mouth twice a day for 7-14 days continue taking if purulent discharge after 7 days Do not soak in tub Shower and allow soapy water to run over abdomen do not scrub incision Splint abdomen when  driving with pillow of towel/blanket folded Change dressing twice a day and when soiled Follow up sooner if fever, nausea, vomiting, diarrhea, worsening abdomen pain/swelling   ED Prescriptions    Medication Sig Dispense Auth. Provider   acetaminophen (TYLENOL) 500 MG tablet Take 2 tablets (1,000 mg total) by mouth every 6 (six) hours as needed. 30 tablet Betancourt, Tina A, NP   ibuprofen (ADVIL,MOTRIN) 800 MG tablet Take 1 tablet (800 mg total) by mouth every 8 (eight) hours as needed for up to 7 days for fever, headache, mild pain or moderate pain. 30 tablet Betancourt, Tina A, NP   mupirocin ointment (BACTROBAN) 2 % Apply twice a day to wound until healed 22 g Betancourt, Tina A, NP   sulfamethoxazole-trimethoprim (BACTRIM DS,SEPTRA DS) 800-160 MG tablet Take 1 tablet by mouth 2 (two) times daily for 14 days. 28 tablet Betancourt, Tina A, NP   OxyCODONE HCl, Abuse Deter, (OXAYDO) 5 MG TABA Take 1 tablet by mouth 2 (two) times daily as needed for up to 3 days. 6 tablet Betancourt, Jarold Songina A, NP     Controlled Substance Prescriptions [place] Controlled Substance Registry consulted? Yes, I have consulted the [place] Controlled Substances Registry for this patient, and feel the risk/benefit ratio today is favorable for proceeding with this prescription for a controlled substance.  20 Rx in past 2 years last 10/26/2017 from dentist for hydrocodone cracked tooth.  Oxycodone 5mg  09/26/2017 c-section infection from OB/GYN.  Short term due to long distance driving and concurrent infection acute pain/worsening.   Barbaraann BarthelBetancourt, Tina A, NP 11/21/17 1058  Addendum patient called stating pharmacy would not have medication in stock until after she departs area.  Called [facility] and notified her to cancel Rx.  She stated she would do so for oxycodone and patient had picked up antibiotic.  [facility] [address] for oxycodone 5mg  po BID prn #6 [id number] per patient request as she contacted them  and they have medication in stock for pick up today.  Patient notified via telephone completed as requested and verbalized understanding and had no further questions at this time.    Barbaraann BarthelBetancourt, Tina A, NP 11/21/17 [phone]

## 2017-11-21 NOTE — ED Triage Notes (Signed)
Patient c/o pain to c-section incision. Patient has a trip coming up and she has to drive a long distance and is stating she needs something for the pain.

## 2017-12-23 ENCOUNTER — Ambulatory Visit: Payer: Medicaid Other

## 2018-01-08 ENCOUNTER — Ambulatory Visit: Payer: Medicaid Other

## 2018-02-05 ENCOUNTER — Encounter: Payer: Self-pay | Admitting: Obstetrics and Gynecology

## 2018-02-05 ENCOUNTER — Ambulatory Visit: Payer: Medicaid Other | Admitting: Obstetrics and Gynecology

## 2018-02-05 VITALS — BP 133/90 | HR 80 | Ht 64.0 in | Wt 257.0 lb

## 2018-02-05 DIAGNOSIS — N6310 Unspecified lump in the right breast, unspecified quadrant: Secondary | ICD-10-CM

## 2018-02-05 DIAGNOSIS — N63 Unspecified lump in unspecified breast: Secondary | ICD-10-CM

## 2018-02-05 NOTE — Progress Notes (Signed)
HPI:      [name] is a 37 y.o. [id number] who LMP was No LMP recorded.  Subjective:   She presents today approximately 4 months postpartum.  Patient delivered at [facility] for severe preeclampsia after being transferred from [facility].  Her baby is doing well after spending 28 days in the NICU.  Patient is no longer breast-feeding.  She had a tubal ligation at the time of her cesarean delivery. She would like her incision checked today because "no one formally checked it". She also complains of a right breast lump.  She denies breast discharge.  She states it is nontender.  It just "feels different".    Hx: The following portions of the patient's history were reviewed and updated as appropriate:             She  has a past medical history of Anxiety, Arthritis, GERD (gastroesophageal reflux disease), Gestational diabetes, Pregnancy induced hypertension, Seizures (HCC), and Vaginal Pap smear, abnormal. She does not have any pertinent problems on file. She  has a past surgical history that includes Ankle fracture surgery and Dilation and evacuation (N/A, 09/04/2016). Her family history includes Alcohol abuse in her father; Arthritis in her maternal grandmother and mother; Asthma in her mother; COPD in her maternal grandmother; Depression in her maternal aunt; Diabetes in her maternal grandfather; Hearing loss in her paternal grandmother; Heart disease in her paternal grandfather; Hyperlipidemia in her maternal grandfather, maternal grandmother, and mother; Hypertension in her maternal grandfather, maternal grandmother, and mother; Kidney disease in her father; Stroke in her mother; Varicose Veins in her maternal grandmother and paternal grandmother; Vision loss in her mother. She  reports that she has quit smoking. She has never used smokeless tobacco. She reports that she does not drink alcohol or use drugs. She has a current medication list which includes the following prescription(s):  acetaminophen and fluticasone. She is allergic to tramadol.       Review of Systems:  Review of Systems  Constitutional: Denied constitutional symptoms, night sweats, recent illness, fatigue, fever, insomnia and weight loss.  Eyes: Denied eye symptoms, eye pain, photophobia, vision change and visual disturbance.  Ears/Nose/Throat/Neck: Denied ear, nose, throat or neck symptoms, hearing loss, nasal discharge, sinus congestion and sore throat.  Cardiovascular: Denied cardiovascular symptoms, arrhythmia, chest pain/pressure, edema, exercise intolerance, orthopnea and palpitations.  Respiratory: Denied pulmonary symptoms, asthma, pleuritic pain, productive sputum, cough, dyspnea and wheezing.  Gastrointestinal: Denied, gastro-esophageal reflux, melena, nausea and vomiting.  Genitourinary: Denied genitourinary symptoms including symptomatic vaginal discharge, pelvic relaxation issues, and urinary complaints.  Musculoskeletal: Denied musculoskeletal symptoms, stiffness, swelling, muscle weakness and myalgia.  Dermatologic: Denied dermatology symptoms, rash and scar.  Neurologic: Denied neurology symptoms, dizziness, headache, neck pain and syncope.  Psychiatric: Denied psychiatric symptoms, anxiety and depression.  Endocrine: Denied endocrine symptoms including hot flashes and night sweats.   Meds:   Current Outpatient Medications on File Prior to Visit  Medication Sig Dispense Refill  . acetaminophen (TYLENOL) 500 MG tablet Take 2 tablets (1,000 mg total) by mouth every 6 (six) hours as needed. 30 tablet 0  . fluticasone (FLONASE) 50 MCG/ACT nasal spray Place 2 sprays into both nostrils daily. 16 g 0   No current facility-administered medications on file prior to visit.     Objective:     Vitals:   02/05/18 0844  BP: 133/90  Pulse: 80              Breast: Exam of the right breast  reveals no dominant masses, there is no axillary adenopathy, there is no nipple discharge.  I asked the  patient to identify the area in question and I could find no palpable abnormality in that area.  Abdomen: Soft.  Non-tender.  No masses.  No HSM.  Incision/s: Intact.  Healing well.  No erythema.  No drainage.      Assessment:    [id number] Patient Active Problem List   Diagnosis Date Noted  . Pre-eclampsia 08/19/2017  . Pregnancy 08/16/2017  . Indication for care in labor or delivery 08/09/2017  . Vitamin D deficiency 10/28/2016  . Chronic ankle pain (Location of Primary Source of Pain) (Left) 10/22/2016  . Long term (current) use of opiate analgesic 10/22/2016  . Long term prescription opiate use 10/22/2016  . Opiate use 10/22/2016  . Traumatic arthritis of ankle (Left) 10/22/2016  . Post-traumatic osteoarthritis of ankle (Left) 10/22/2016  . Low serum potassium level 10/22/2016  . Chronic pain syndrome 10/21/2016  . Cervical spondylosis with radiculopathy 10/21/2016  . Long term prescription benzodiazepine use 10/21/2016  . Cervical radiculopathy, chronic 10/21/2016  . Anxiety 10/16/2016  . GERD (gastroesophageal reflux disease) 10/16/2016  . Seizures (HCC) 10/16/2016  . Herniation of intervertebral disc at C5-C6 level 12/01/2015  . History of fracture of left ankle 12/01/2015  . Chronic shoulder pain (Location of Secondary source of pain) (Right) 12/01/2015  . Chronic neck pain 10/09/2015     1. Breast lump in female     No masses noted.   Plan:            1.  I have reassured her regarding the possible breast lump.  Her incision looks great.  Discussed genetic testing for inheritable conditions and patient would like to consider this because her father has liver cancer.  Literature given. Orders No orders of the defined types were placed in this encounter.   No orders of the defined types were placed in this encounter.     F/U  Return for Annual Physical. I spent 17 minutes involved in the care of this patient of which greater than 50% was spent discussing her  delivery, cesarean delivery, [facility], follow-up of the baby from the NICU, current issue with possible breast lump, history of liver cancer in the family and genetic testing.  All questions answered.  Elonda Huskyavid J. Meenakshi Sazama, M.D. 02/05/2018 9:12 AM

## 2018-02-05 NOTE — Progress Notes (Signed)
Pt presents today for an incision check and breast exam.

## 2018-05-28 IMAGING — CR DG CERVICAL SPINE COMPLETE 4+V
6 series · 6 of 6 positions shown · non-contrast
Comparison: None.

CLINICAL DATA: Cervicalgia

EXAM:
CERVICAL SPINE - COMPLETE 4+ VIEW

[c-spine lat]
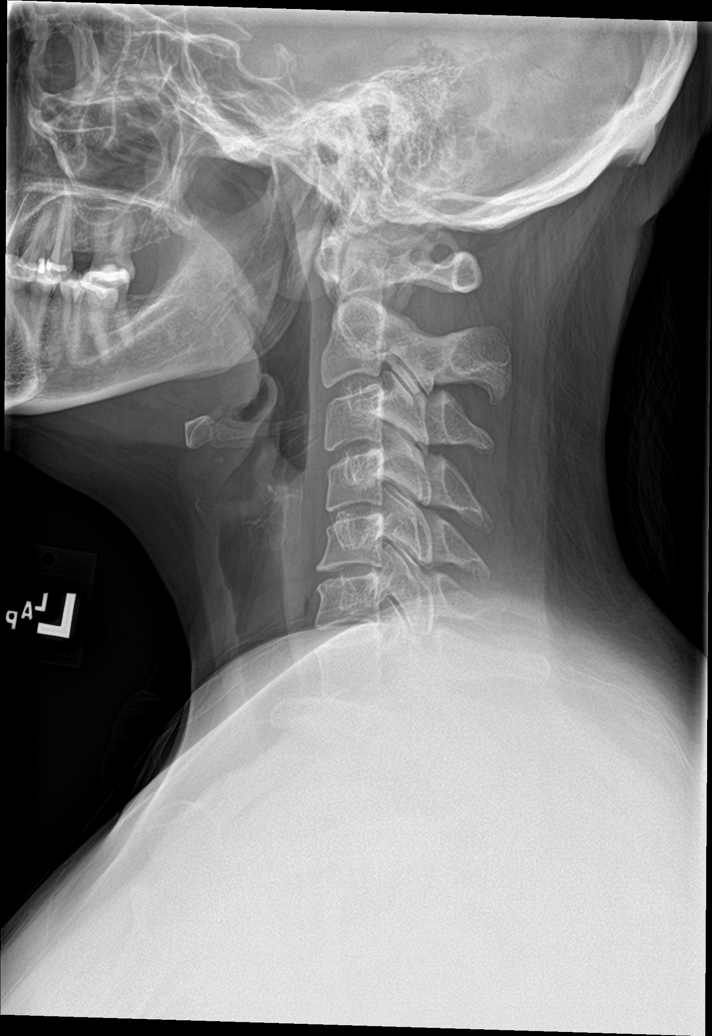

[c-spine obl (1 of 2)]
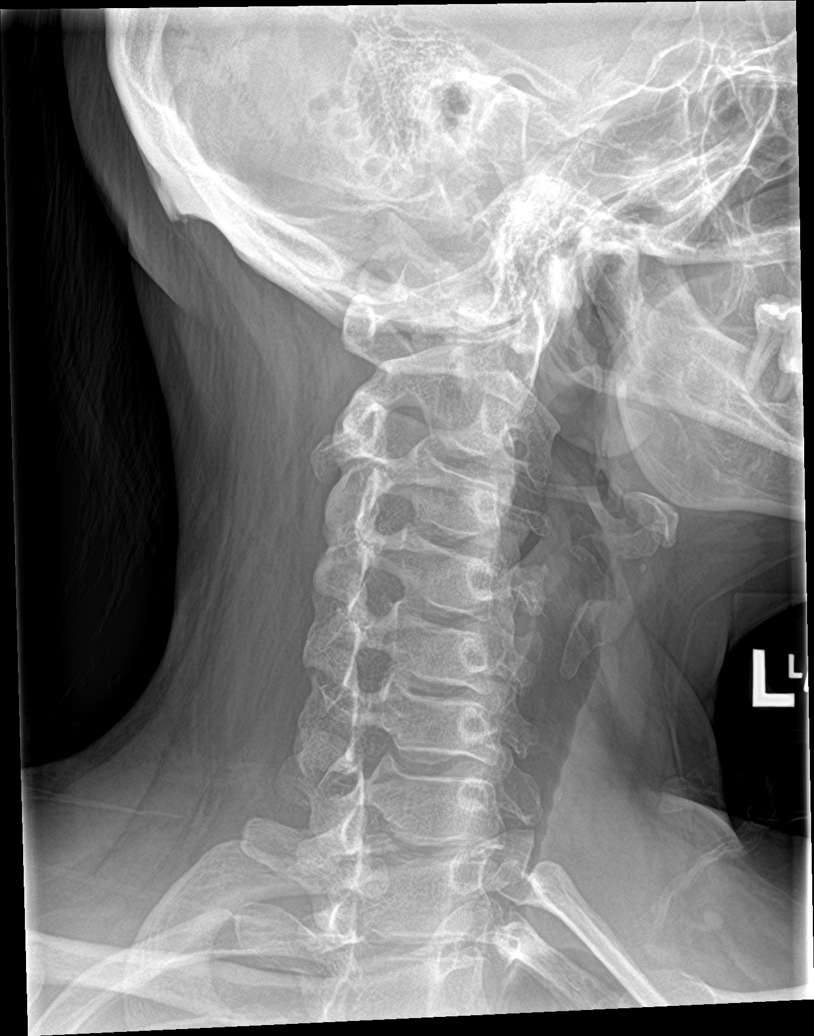

[c-spine obl (2 of 2)]
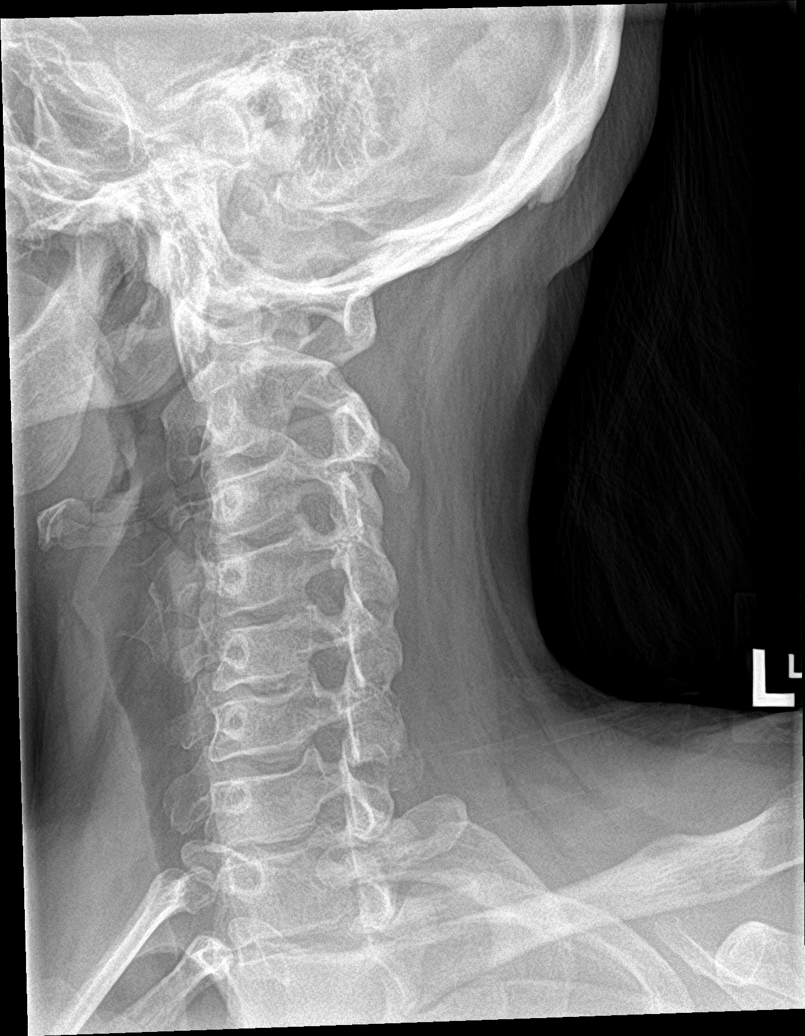

[c-spine ap]
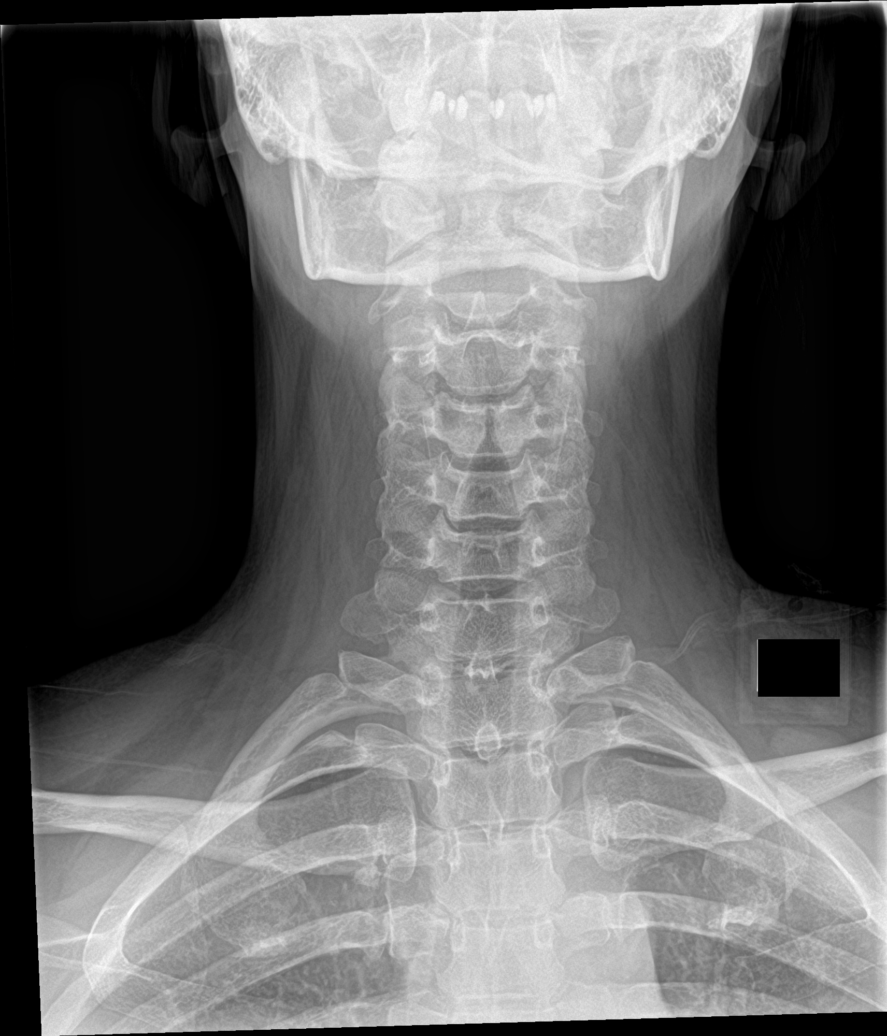

[c-spine open mouth]
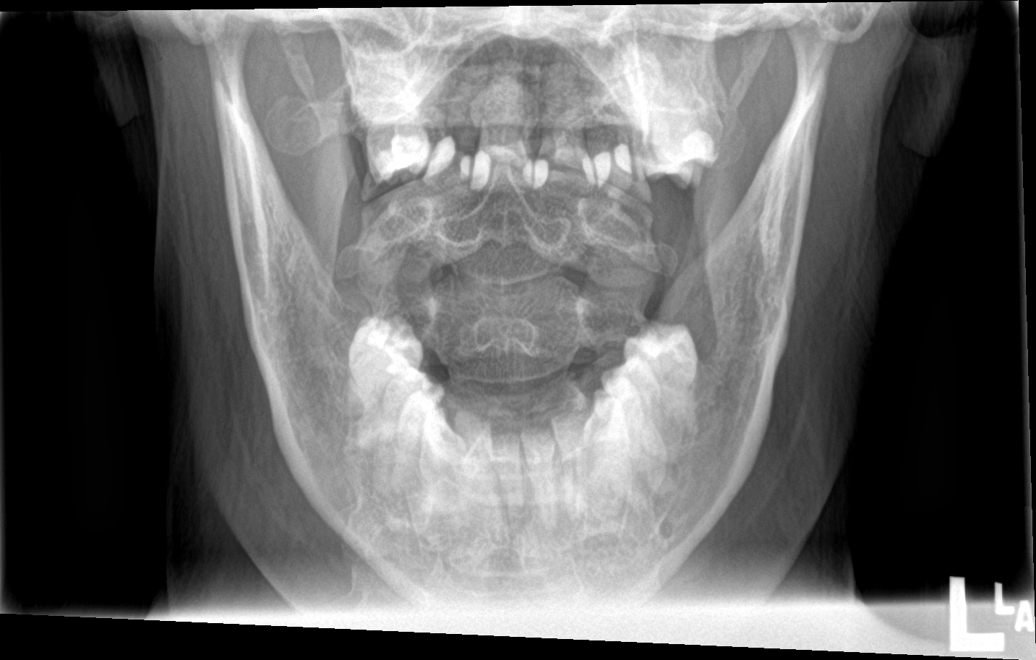

[c-spine swimmers]
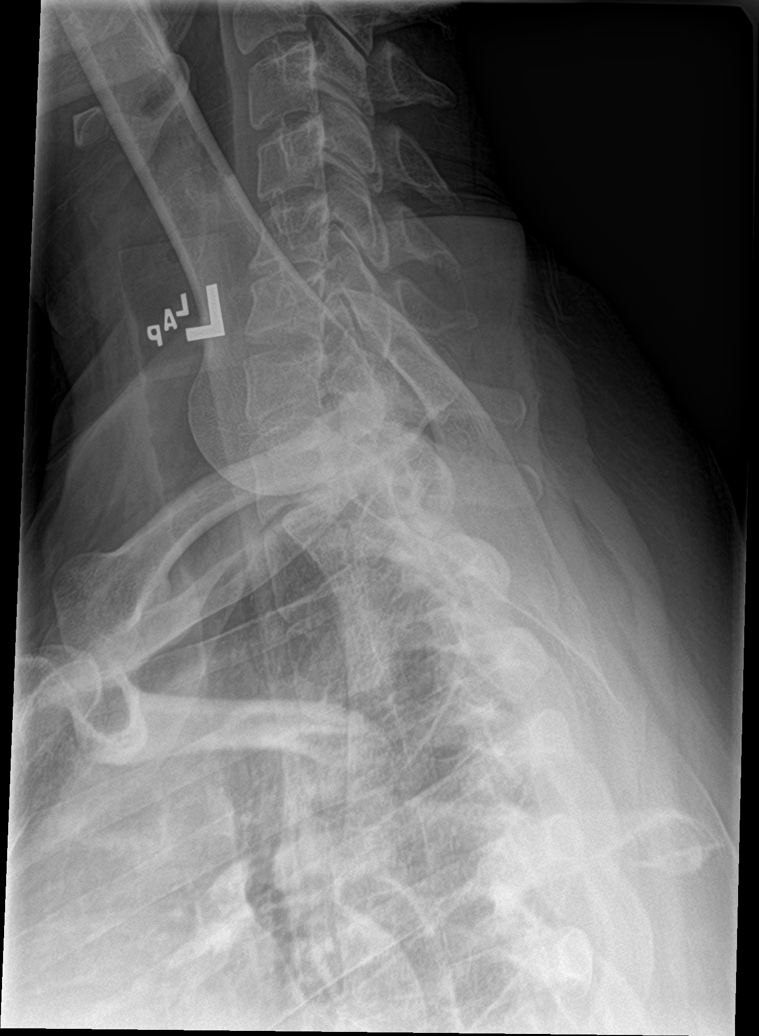

[6 of 6 positions shown; findings below may reference images not displayed]

FINDINGS: Frontal, lateral, open-mouth odontoid, and bilateral oblique views
were obtained. There is no fracture or spondylolisthesis.
Prevertebral soft tissues and predental space regions are normal.
There is moderate disc space narrowing at C5-6. Other disc spaces
appear normal. There are small anterior osteophytes at C5 and C6.
There is no appreciable exit foraminal narrowing on the oblique
views. There is mild reversal of lordotic curvature. Lung apices are
clear.
IMPRESSION: Disc space narrowing at C5-6. Other disc spaces appear unremarkable.
No erosive change. No fracture or spondylolisthesis. Reversal of
lordotic curvature is likely due to chronic muscle spasm.

## 2018-08-22 ENCOUNTER — Other Ambulatory Visit: Payer: Self-pay

## 2018-08-22 ENCOUNTER — Encounter: Payer: Self-pay | Admitting: Emergency Medicine

## 2018-08-22 ENCOUNTER — Ambulatory Visit
Admission: EM | Admit: 2018-08-22 | Discharge: 2018-08-22 | Disposition: A | Discharge status: Home / Self Care | Payer: Medicaid Other | Attending: Family Medicine | Admitting: Family Medicine

## 2018-08-22 DIAGNOSIS — R059 Cough, unspecified: Secondary | ICD-10-CM

## 2018-08-22 DIAGNOSIS — R05 Cough: Secondary | ICD-10-CM | POA: Diagnosis not present

## 2018-08-22 MED ORDER — HYDROCOD POLST-CPM POLST ER 10-8 MG/5ML PO SUER: 5.0000 mL | Freq: Every evening | ORAL | 0 refills | Status: DC | PRN

## 2018-08-22 MED ORDER — ALBUTEROL SULFATE HFA 108 (90 BASE) MCG/ACT IN AERS: 1.0000 | INHALATION_SPRAY | Freq: Four times a day (QID) | RESPIRATORY_TRACT | 0 refills | Status: DC | PRN

## 2018-08-22 MED ORDER — BENZONATATE 100 MG PO CAPS: 100.0000 mg | ORAL_CAPSULE | Freq: Three times a day (TID) | ORAL | 0 refills | Status: DC | PRN

## 2018-08-22 NOTE — ED Provider Notes (Signed)
[facility]    CSN: [id number] Arrival date & time: 08/22/18  1232  History   Chief Complaint Chief Complaint  Patient presents with  . Cough   HPI  38 year old female presents with cough.  Reports cough for the past 2 weeks.  Patient reports associated bilateral chest pain which she describes as soreness.  Patient states she is taken over-the-counter medication without relief.  Patient states that she had a fever on Monday of 101.7.  No fever since.  No reports of shortness of breath.  Seems to be worsening.  No known exacerbating factors.  No other associated symptoms.  Past Medical History:  Diagnosis Date  . Anxiety   . Arthritis   . GERD (gastroesophageal reflux disease)   . Gestational diabetes    borderline with daughter  . Pregnancy induced hypertension    flucuates  . Seizures (HCC)    only one due to a reaction to tramadol  . Vaginal Pap smear, abnormal    had colposcopy    Patient Active Problem List   Diagnosis Date Noted  . Pre-eclampsia 08/19/2017  . Pregnancy 08/16/2017  . Indication for care in labor or delivery 08/09/2017  . Vitamin D deficiency 10/28/2016  . Chronic ankle pain (Location of Primary Source of Pain) (Left) 10/22/2016  . Long term (current) use of opiate analgesic 10/22/2016  . Long term prescription opiate use 10/22/2016  . Opiate use 10/22/2016  . Traumatic arthritis of ankle (Left) 10/22/2016  . Post-traumatic osteoarthritis of ankle (Left) 10/22/2016  . Low serum potassium level 10/22/2016  . Chronic pain syndrome 10/21/2016  . Cervical spondylosis with radiculopathy 10/21/2016  . Long term prescription benzodiazepine use 10/21/2016  . Cervical radiculopathy, chronic 10/21/2016  . Anxiety 10/16/2016  . GERD (gastroesophageal reflux disease) 10/16/2016  . Seizures (HCC) 10/16/2016  . Herniation of intervertebral disc at C5-C6 level 12/01/2015  . History of fracture of left ankle 12/01/2015  . Chronic shoulder pain  (Location of Secondary source of pain) (Right) 12/01/2015  . Chronic neck pain 10/09/2015    Past Surgical History:  Procedure Laterality Date  . ANKLE FRACTURE SURGERY    . DILATION AND EVACUATION N/A 09/04/2016   Procedure: DILATATION AND EVACUATION;  Surgeon: Linzie Collin, MD;  Location: [facility] ORS;  Service: Gynecology;  Laterality: N/A;    OB History    Gravida  5   Para  1   Term  1   Preterm      AB  2   Living  1     SAB  1   TAB  1   Ectopic      Multiple      Live Births  1            Home Medications    Prior to Admission medications   Medication Sig Start Date End Date Taking? Authorizing Provider  albuterol (PROVENTIL HFA;VENTOLIN HFA) 108 (90 Base) MCG/ACT inhaler Inhale 1-2 puffs into the lungs every 6 (six) hours as needed for wheezing or shortness of breath. 08/22/18   Tommie Sams, DO  benzonatate (TESSALON) 100 MG capsule Take 1 capsule (100 mg total) by mouth 3 (three) times daily as needed. 08/22/18   Tommie Sams, DO  chlorpheniramine-HYDROcodone (TUSSIONEX PENNKINETIC ER) 10-8 MG/5ML SUER Take 5 mLs by mouth at bedtime as needed. 08/22/18   Tommie Sams, DO    Family History Family History  Problem Relation Age of Onset  . Arthritis Mother   .  Asthma Mother   . Hyperlipidemia Mother   . Hypertension Mother   . Stroke Mother   . Vision loss Mother   . Alcohol abuse Father   . Kidney disease Father   . Depression Maternal Aunt   . Arthritis Maternal Grandmother   . COPD Maternal Grandmother   . Hyperlipidemia Maternal Grandmother   . Hypertension Maternal Grandmother   . Varicose Veins Maternal Grandmother   . Diabetes Maternal Grandfather   . Hyperlipidemia Maternal Grandfather   . Hypertension Maternal Grandfather   . Hearing loss Paternal Grandmother   . Varicose Veins Paternal Grandmother   . Heart disease Paternal Grandfather     Social History Social History   Tobacco Use  . Smoking status: Former Games developer  .  Smokeless tobacco: Never Used  Substance Use Topics  . Alcohol use: No  . Drug use: No     Allergies   Tramadol   Review of Systems Review of Systems  Constitutional:       Fever Monday. None since.  Respiratory: Positive for cough.        Chest wall pain.   Physical Exam Triage Vital Signs ED Triage Vitals  Enc Vitals Group     BP 08/22/18 1242 (!) 135/97     Pulse Rate 08/22/18 1242 68     Resp 08/22/18 1242 16     Temp 08/22/18 1242 98.2 F (36.8 C)     Temp Source 08/22/18 1242 Oral     SpO2 08/22/18 1242 99 %     Weight 08/22/18 1240 220 lb (99.8 kg)     Height 08/22/18 1240 5\' 4"  (1.626 m)     Head Circumference --      Peak Flow --      Pain Score 08/22/18 1240 5     Pain Loc --      Pain Edu? --      Excl. in GC? --    Updated Vital Signs BP (!) 135/97 (BP Location: Left Arm)   Pulse 68   Temp 98.2 F (36.8 C) (Oral)   Resp 16   Ht 5\' 4"  (1.626 m)   Wt 99.8 kg   LMP 07/27/2018 (Approximate)   SpO2 99%   BMI 37.76 kg/m   Visual Acuity Right Eye Distance:   Left Eye Distance:   Bilateral Distance:    Right Eye Near:   Left Eye Near:    Bilateral Near:     Physical Exam Constitutional:      General: She is not in acute distress.    Appearance: Normal appearance. She is obese.  HENT:     Head: Normocephalic and atraumatic.     Mouth/Throat:     Pharynx: Oropharynx is clear. No oropharyngeal exudate or posterior oropharyngeal erythema.  Eyes:     General:        Right eye: No discharge.        Left eye: No discharge.     Conjunctiva/sclera: Conjunctivae normal.  Cardiovascular:     Rate and Rhythm: Normal rate and regular rhythm.  Pulmonary:     Effort: Pulmonary effort is normal.     Breath sounds: Normal breath sounds.  Neurological:     Mental Status: She is alert.  Psychiatric:        Mood and Affect: Mood normal.        Behavior: Behavior normal.    UC Treatments / Results  Labs (all labs ordered are listed, but only  abnormal results are displayed) Labs Reviewed - No data to display  EKG None  Radiology No results found.  Procedures Procedures (including critical care time)  Medications Ordered in UC Medications - No data to display  Initial Impression / Assessment and Plan / UC Course  I have reviewed the triage vital signs and the nursing notes.  Pertinent labs & imaging results that were available during my care of the patient were reviewed by me and considered in my medical decision making (see chart for details).    39 year old female presents with persistent cough.  Likely viral.  No indication for further work-up with imaging or laboratory studies at this time.  She is very well-appearing.  Tessalon Perles, Tussionex, Albuterol as needed.   Final Clinical Impressions(s) / UC Diagnoses   Final diagnoses:  Cough     Discharge Instructions     This likely viral/post viral. No need for antibiotics.  Medications as prescribed.  Rest.  Take care  Dr. Adriana Simas    ED Prescriptions    Medication Sig Dispense Auth. Provider   benzonatate (TESSALON) 100 MG capsule Take 1 capsule (100 mg total) by mouth 3 (three) times daily as needed. 30 capsule Hope, [place] G, DO   chlorpheniramine-HYDROcodone (TUSSIONEX PENNKINETIC ER) 10-8 MG/5ML SUER Take 5 mLs by mouth at bedtime as needed. 60 mL Gerardine Peltz G, DO   albuterol (PROVENTIL HFA;VENTOLIN HFA) 108 (90 Base) MCG/ACT inhaler Inhale 1-2 puffs into the lungs every 6 (six) hours as needed for wheezing or shortness of breath. 1 Inhaler Tommie Sams, DO     Controlled Substance Prescriptions [place] Controlled Substance Registry consulted? Not Applicable   Tommie Sams, DO 08/22/18 1309

## 2018-08-22 NOTE — ED Triage Notes (Signed)
Patient c/o cough and chest congestion for 2 weeks.  Patient denies recent fevers.

## 2018-08-22 NOTE — Discharge Instructions (Signed)
This likely viral/post viral. No need for antibiotics.  Medications as prescribed.  Rest.  Take care  Dr. Adriana Simas

## 2018-12-22 ENCOUNTER — Ambulatory Visit
Admission: EM | Admit: 2018-12-22 | Discharge: 2018-12-22 | Disposition: A | Discharge status: Home / Self Care | Payer: Medicaid Other

## 2018-12-22 ENCOUNTER — Other Ambulatory Visit: Payer: Self-pay

## 2018-12-22 DIAGNOSIS — M62830 Muscle spasm of back: Secondary | ICD-10-CM | POA: Diagnosis not present

## 2018-12-22 DIAGNOSIS — M546 Pain in thoracic spine: Secondary | ICD-10-CM | POA: Diagnosis not present

## 2018-12-22 MED ORDER — METHOCARBAMOL 500 MG PO TABS: 500.0000 mg | ORAL_TABLET | Freq: Two times a day (BID) | ORAL | 0 refills | Status: AC
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
## 2018-12-22 NOTE — Discharge Instructions (Signed)
It was very nice seeing you today in clinic. Thank you for entrusting me with your care.   As discussed, your pain seems to be musculoskeletal in nature. Plans for treating you are as follows:  Please utilize the medications that we discussed. Your prescriptions have been called in to your pharmacy.  May use Tylenol and/or Ibuprofen as needed for pain. Avoid overdoing it, but you need to make efforts to remain active as tolerated.  Avoiding activity all together can make your pain worse. You may find that alternating between ice and moist heat application will help with your pain.  Heat/ice should be applied for 10-15 minutes at a time at least 3-4 times a day.  Make arrangements to follow up with your regular doctor in 1 week for re-evaluation. If your symptoms/condition worsens, please seek follow up care either here or in the ER. Please remember, our [place] providers are "right here with you" when you need [place].   Again, it was my pleasure to take care of you today. Thank you for choosing our clinic. I hope that you start to feel better quickly.   Honor Loh, MSN, APRN, FNP-C, CEN Advanced Practice Provider [facility]

## 2019-03-12 ENCOUNTER — Other Ambulatory Visit: Payer: Self-pay

## 2019-03-12 DIAGNOSIS — Z20822 Contact with and (suspected) exposure to covid-19: Secondary | ICD-10-CM

## 2019-03-13 LAB — NOVEL CORONAVIRUS, NAA: SARS-CoV-2, NAA: NOT DETECTED

## 2019-04-12 ENCOUNTER — Other Ambulatory Visit: Payer: Self-pay | Admitting: *Deleted

## 2019-04-12 DIAGNOSIS — Z20822 Contact with and (suspected) exposure to covid-19: Secondary | ICD-10-CM

## 2019-04-14 LAB — NOVEL CORONAVIRUS, NAA: SARS-CoV-2, NAA: NOT DETECTED
